# Patient Record
Sex: Female | Born: 1985 | Race: White | Hispanic: No | Marital: Married | Smoking: Never smoker
Health system: Southern US, Community
[De-identification: ages and names within clinical notes are randomized; demographics above are authoritative.]

## PROBLEM LIST (undated history)

## (undated) ENCOUNTER — Inpatient Hospital Stay (HOSPITAL_COMMUNITY): Payer: Self-pay

## (undated) DIAGNOSIS — R87629 Unspecified abnormal cytological findings in specimens from vagina: Secondary | ICD-10-CM

## (undated) HISTORY — PX: WISDOM TOOTH EXTRACTION: SHX21

## (undated) HISTORY — DX: Unspecified abnormal cytological findings in specimens from vagina: R87.629

---

## 2013-02-17 NOTE — L&D Delivery Note (Signed)
Delivery Note At 8:24 PM a viable female was delivered via Vaginal, Spontaneous Delivery (Presentation: Right Occiput Anterior).  APGAR: 6, 6; weight 6 lb 5.8 oz (2886 g).   Placenta status: Intact, Spontaneous.  Cord: 3 vessels with the following complications: None.  Cord pH: na  Anesthesia: None  Episiotomy: None Lacerations: None Est. Blood Loss (mL): 250  Mom to postpartum.  Baby to Couplet care / Skin to Skin.  Kelly Logan, Kelly Logan 12/20/2013, 10:00 PM

## 2013-06-03 ENCOUNTER — Encounter: Payer: Self-pay | Admitting: Advanced Practice Midwife

## 2013-06-03 ENCOUNTER — Ambulatory Visit (INDEPENDENT_AMBULATORY_CARE_PROVIDER_SITE_OTHER): Payer: BC Managed Care – PPO | Admitting: Advanced Practice Midwife

## 2013-06-03 VITALS — BP 128/83 | Ht 66.5 in | Wt 148.0 lb

## 2013-06-03 DIAGNOSIS — Z3480 Encounter for supervision of other normal pregnancy, unspecified trimester: Secondary | ICD-10-CM | POA: Insufficient documentation

## 2013-06-03 DIAGNOSIS — Z124 Encounter for screening for malignant neoplasm of cervix: Secondary | ICD-10-CM

## 2013-06-03 NOTE — Addendum Note (Signed)
Addended by: Granville LewisLARK, Maryela Tapper L on: 06/03/2013 11:30 AM   Modules accepted: Orders

## 2013-06-03 NOTE — Addendum Note (Signed)
Addended by: Granville LewisLARK, Ami Thornsberry L on: 06/03/2013 11:28 AM   Modules accepted: Orders

## 2013-06-03 NOTE — Progress Notes (Signed)
   Subjective:    Kelly Logan is a [id number] 6834w2d by LMP being seen today for her first obstetrical visit.  Her obstetrical history is significant for NSVD x2. Patient does intend to breast feed. Pregnancy history fully reviewed.  Patient reports breast tenderness.  Filed Vitals:   06/03/13 0847 06/03/13 0857  BP: 128/83   Height:  5' 6.5" (1.689 m)  Weight: 148 lb (67.132 kg)     HISTORY: OB History  Gravida Para Term Preterm AB SAB TAB Ectopic Multiple Living  5 2 1 1 2 2    2     # Outcome Date GA Lbr Len/2nd Weight Sex Delivery Anes PTL Lv  5 CUR           4 PRE 01/03/10   5 lb 15 oz (2.693 kg) M SVD None N Y  3 TRM 08/08/07   7 lb 3 oz (3.26 kg) M SVD None N Y  2 SAB           1 SAB              Past Medical History  Diagnosis Date  . Preterm labor   . Vaginal Pap smear, abnormal    Past Surgical History  Procedure Laterality Date  . Wisdom tooth extraction     Family History  Problem Relation Age of Onset  . Diabetes Paternal Grandmother   . Diabetes Paternal Grandfather   . Cancer Paternal Grandmother     lung  . Cancer Paternal Grandfather     prostate     Exam    Uterus:   ~10 week size  Pelvic Exam:    Perineum: No Hemorrhoids, Normal Perineum   Vulva: normal   Vagina:  normal mucosa, curdlike discharge   pH:    Cervix: multiparous appearance, no bleeding following Pap, no cervical motion tenderness and no lesions   Adnexa: normal adnexa and no mass, fullness, tenderness   Bony Pelvis: average and gynecoid  System: Breast:  normal appearance, no masses or tenderness   Skin: normal coloration and turgor, no rashes    Neurologic: oriented, normal, gait normal; reflexes normal and symmetric   Extremities: normal strength, tone, and muscle mass, ROM of all joints is normal   HEENT neck supple with midline trachea and thyroid without masses   Mouth/Teeth mucous membranes moist, pharynx normal without lesions and dental hygiene good   Neck  supple and no masses   Cardiovascular: regular rate and rhythm   Respiratory:  appears well, vitals normal, no respiratory distress, acyanotic, normal RR, ear and throat exam is normal, neck free of mass or lymphadenopathy, chest clear, no wheezing, crepitations, rhonchi, normal symmetric air entry   Abdomen: soft, non-tender; bowel sounds normal; no masses,  no organomegaly   Urinary: urethral meatus normal    Bedside ultrasound by Mariel AloeLora Clark, RN, with CRL measurement of 456w2d.  EDD based on ultrasound: 01/04/14.     Assessment:    Pregnancy: [id number] Patient Active Problem List   Diagnosis Date Noted  . Supervision of normal subsequent pregnancy 06/03/2013        Plan:     Initial labs drawn. Prenatal vitamins. Pap collected at today's visit. Problem list reviewed and updated. Genetic Screening discussed First Screen and Quad Screen: declined.  Ultrasound discussed; fetal survey: requested.  Follow up in 4 weeks. 50% of 30 min visit spent on counseling and coordination of care.     Wilmer FloorLisa A Leftwich-Kirby 06/03/2013

## 2013-06-03 NOTE — Progress Notes (Signed)
p-88  Bedside U/S showed IUP with CRL 24.9 and GA of 9 w 2 d

## 2013-06-03 NOTE — Progress Notes (Signed)
p-93  Poss yeast infection

## 2013-06-04 LAB — GC/CHLAMYDIA PROBE AMP
CT PROBE, AMP APTIMA: NEGATIVE
GC Probe RNA: NEGATIVE

## 2013-06-04 LAB — OBSTETRIC PANEL
Antibody Screen: NEGATIVE
BASOS ABS: 0 10*3/uL (ref 0.0–0.1)
Basophils Relative: 0 % (ref 0–1)
Eosinophils Absolute: 0.1 10*3/uL (ref 0.0–0.7)
Eosinophils Relative: 1 % (ref 0–5)
HCT: 38 % (ref 36.0–46.0)
HEP B S AG: NEGATIVE
Hemoglobin: 13.4 g/dL (ref 12.0–15.0)
LYMPHS ABS: 1.2 10*3/uL (ref 0.7–4.0)
LYMPHS PCT: 17 % (ref 12–46)
MCH: 30.8 pg (ref 26.0–34.0)
MCHC: 35.3 g/dL (ref 30.0–36.0)
MCV: 87.4 fL (ref 78.0–100.0)
Monocytes Absolute: 0.4 10*3/uL (ref 0.1–1.0)
Monocytes Relative: 6 % (ref 3–12)
NEUTROS PCT: 76 % (ref 43–77)
Neutro Abs: 5.2 10*3/uL (ref 1.7–7.7)
Platelets: 292 10*3/uL (ref 150–400)
RBC: 4.35 MIL/uL (ref 3.87–5.11)
RDW: 13.9 % (ref 11.5–15.5)
Rh Type: POSITIVE
Rubella: 1.13 Index — ABNORMAL HIGH (ref <0.90)
WBC: 6.8 10*3/uL (ref 4.0–10.5)

## 2013-06-04 LAB — HIV ANTIBODY (ROUTINE TESTING W REFLEX): HIV: NONREACTIVE

## 2013-06-04 LAB — WET PREP FOR TRICH, YEAST, CLUE
Clue Cells Wet Prep HPF POC: NONE SEEN
Trich, Wet Prep: NONE SEEN

## 2013-06-06 LAB — CULTURE, URINE COMPREHENSIVE: Colony Count: 25000

## 2013-06-08 ENCOUNTER — Telehealth: Payer: Self-pay | Admitting: *Deleted

## 2013-06-08 NOTE — Telephone Encounter (Signed)
Called pt to adv wet prep shows yeast and can call in Diflucan 150mg  to take today and then again in 3 days per midwife Misty StanleyLisa. Pt states she thinks she has it cleared and didn't want to take Diflucan since she was early in pregnancy. I adv pt that if anything changes we can call in meds. Pt expressed understanding.

## 2013-06-10 ENCOUNTER — Telehealth: Payer: Self-pay | Admitting: *Deleted

## 2013-06-10 DIAGNOSIS — B379 Candidiasis, unspecified: Secondary | ICD-10-CM

## 2013-06-10 MED ORDER — FLUCONAZOLE 150 MG PO TABS: 150.0000 mg | ORAL_TABLET | Freq: Once | ORAL | Status: DC

## 2013-06-10 NOTE — Telephone Encounter (Signed)
Called in Diflucan for yeast infection

## 2013-07-04 ENCOUNTER — Ambulatory Visit (INDEPENDENT_AMBULATORY_CARE_PROVIDER_SITE_OTHER): Payer: BC Managed Care – PPO | Admitting: Obstetrics and Gynecology

## 2013-07-04 ENCOUNTER — Encounter: Payer: Self-pay | Admitting: Obstetrics and Gynecology

## 2013-07-04 VITALS — BP 116/67 | HR 96 | Wt 153.0 lb

## 2013-07-04 DIAGNOSIS — Z3480 Encounter for supervision of other normal pregnancy, unspecified trimester: Secondary | ICD-10-CM

## 2013-07-04 DIAGNOSIS — O09899 Supervision of other high risk pregnancies, unspecified trimester: Secondary | ICD-10-CM

## 2013-07-04 DIAGNOSIS — Z3490 Encounter for supervision of normal pregnancy, unspecified, unspecified trimester: Secondary | ICD-10-CM

## 2013-07-04 DIAGNOSIS — O09219 Supervision of pregnancy with history of pre-term labor, unspecified trimester: Secondary | ICD-10-CM

## 2013-07-04 NOTE — Progress Notes (Signed)
Reviewed lab results: yeast on WP was treated with Diflucan and now asymptomatic.  25K staph aureus on urine culture> discussed and will repeat culture. Hx PTB at 36-37 wks, baby had short stay in NICU on oxyhood> discussed 17-P and she declines.

## 2013-07-04 NOTE — Patient Instructions (Addendum)
Second Trimester of Pregnancy The second trimester is from week 13 through week 28, months 4 through 6. The second trimester is often a time when you feel your best. Your body has also adjusted to being pregnant, and you begin to feel better physically. Usually, morning sickness has lessened or quit completely, you may have more energy, and you may have an increase in appetite. The second trimester is also a time when the fetus is growing rapidly. At the end of the sixth month, the fetus is about 9 inches long and weighs about 1 pounds. You will likely begin to feel the baby move (quickening) between 18 and 20 weeks of the pregnancy. BODY CHANGES Your body goes through many changes during pregnancy. The changes vary from woman to woman.   Your weight will continue to increase. You will notice your lower abdomen bulging out.  You may begin to get stretch marks on your hips, abdomen, and breasts.  You may develop headaches that can be relieved by medicines approved by your caregiver.  You may urinate more often because the fetus is pressing on your bladder.  You may develop or continue to have heartburn as a result of your pregnancy.  You may develop constipation because certain hormones are causing the muscles that push waste through your intestines to slow down.  You may develop hemorrhoids or swollen, bulging veins (varicose veins).  You may have back pain because of the weight gain and pregnancy hormones relaxing your joints between the bones in your pelvis and as a result of a shift in weight and the muscles that support your balance.  Your breasts will continue to grow and be tender.  Your gums may bleed and may be sensitive to brushing and flossing.  Dark spots or blotches (chloasma, mask of pregnancy) may develop on your face. This will likely fade after the baby is born.  A dark line from your belly button to the pubic area (linea nigra) may appear. This will likely fade after the  baby is born. WHAT TO EXPECT AT YOUR PRENATAL VISITS During a routine prenatal visit:  You will be weighed to make sure you and the fetus are growing normally.  Your blood pressure will be taken.  Your abdomen will be measured to track your baby's growth.  The fetal heartbeat will be listened to.  Any test results from the previous visit will be discussed. Your caregiver may ask you:  How you are feeling.  If you are feeling the baby move.  If you have had any abnormal symptoms, such as leaking fluid, bleeding, severe headaches, or abdominal cramping.  If you have any questions. Other tests that may be performed during your second trimester include:  Blood tests that check for:  Low iron levels (anemia).  Gestational diabetes (between 24 and 28 weeks).  Rh antibodies.  Urine tests to check for infections, diabetes, or protein in the urine.  An ultrasound to confirm the proper growth and development of the baby.  An amniocentesis to check for possible genetic problems.  Fetal screens for spina bifida and Down syndrome. HOME CARE INSTRUCTIONS   Avoid all smoking, herbs, alcohol, and unprescribed drugs. These chemicals affect the formation and growth of the baby.  Follow your caregiver's instructions regarding medicine use. There are medicines that are either safe or unsafe to take during pregnancy.  Exercise only as directed by your caregiver. Experiencing uterine cramps is a good sign to stop exercising.  Continue to eat regular,   healthy meals.  Wear a good support bra for breast tenderness.  Do not use hot tubs, steam rooms, or saunas.  Wear your seat belt at all times when driving.  Avoid raw meat, uncooked cheese, cat litter boxes, and soil used by cats. These carry germs that can cause birth defects in the baby.  Take your prenatal vitamins.  Try taking a stool softener (if your caregiver approves) if you develop constipation. Eat more high-fiber foods,  such as fresh vegetables or fruit and whole grains. Drink plenty of fluids to keep your urine clear or pale yellow.  Take warm sitz baths to soothe any pain or discomfort caused by hemorrhoids. Use hemorrhoid cream if your caregiver approves.  If you develop varicose veins, wear support hose. Elevate your feet for 15 minutes, 3 4 times a day. Limit salt in your diet.  Avoid heavy lifting, wear low heel shoes, and practice good posture.  Rest with your legs elevated if you have leg cramps or low back pain.  Visit your dentist if you have not gone yet during your pregnancy. Use a soft toothbrush to brush your teeth and be gentle when you floss.  A sexual relationship may be continued unless your caregiver directs you otherwise.  Continue to go to all your prenatal visits as directed by your caregiver. SEEK MEDICAL CARE IF:   You have dizziness.  You have mild pelvic cramps, pelvic pressure, or nagging pain in the abdominal area.  You have persistent nausea, vomiting, or diarrhea.  You have a bad smelling vaginal discharge.  You have pain with urination. SEEK IMMEDIATE MEDICAL CARE IF:   You have a fever.  You are leaking fluid from your vagina.  You have spotting or bleeding from your vagina.  You have severe abdominal cramping or pain.  You have rapid weight gain or loss.  You have shortness of breath with chest pain.  You notice sudden or extreme swelling of your face, hands, ankles, feet, or legs.  You have not felt your baby move in over an hour.  You have severe headaches that do not go away with medicine.  You have vision changes. Document Released: 01/28/2001 Document Revised: 10/06/2012 Document Reviewed: 04/06/2012 Exit[employer] Patient Information 2014 [employer].  

## 2013-07-05 LAB — URINE CULTURE

## 2013-08-01 ENCOUNTER — Ambulatory Visit (INDEPENDENT_AMBULATORY_CARE_PROVIDER_SITE_OTHER): Payer: BC Managed Care – PPO | Admitting: Advanced Practice Midwife

## 2013-08-01 VITALS — BP 106/66 | HR 92 | Wt 159.0 lb

## 2013-08-01 DIAGNOSIS — Z3480 Encounter for supervision of other normal pregnancy, unspecified trimester: Secondary | ICD-10-CM

## 2013-08-01 NOTE — Patient Instructions (Signed)
Second Trimester of Pregnancy The second trimester is from week 13 through week 28, months 4 through 6. The second trimester is often a time when you feel your best. Your body has also adjusted to being pregnant, and you begin to feel better physically. Usually, morning sickness has lessened or quit completely, you may have more energy, and you may have an increase in appetite. The second trimester is also a time when the fetus is growing rapidly. At the end of the sixth month, the fetus is about 9 inches long and weighs about 1 pounds. You will likely begin to feel the baby move (quickening) between 18 and 20 weeks of the pregnancy. BODY CHANGES Your body goes through many changes during pregnancy. The changes vary from woman to woman.   Your weight will continue to increase. You will notice your lower abdomen bulging out.  You may begin to get stretch marks on your hips, abdomen, and breasts.  You may develop headaches that can be relieved by medicines approved by your caregiver.  You may urinate more often because the fetus is pressing on your bladder.  You may develop or continue to have heartburn as a result of your pregnancy.  You may develop constipation because certain hormones are causing the muscles that push waste through your intestines to slow down.  You may develop hemorrhoids or swollen, bulging veins (varicose veins).  You may have back pain because of the weight gain and pregnancy hormones relaxing your joints between the bones in your pelvis and as a result of a shift in weight and the muscles that support your balance.  Your breasts will continue to grow and be tender.  Your gums may bleed and may be sensitive to brushing and flossing.  Dark spots or blotches (chloasma, mask of pregnancy) may develop on your face. This will likely fade after the baby is born.  A dark line from your belly button to the pubic area (linea nigra) may appear. This will likely fade after the  baby is born. WHAT TO EXPECT AT YOUR PRENATAL VISITS During a routine prenatal visit:  You will be weighed to make sure you and the fetus are growing normally.  Your blood pressure will be taken.  Your abdomen will be measured to track your baby's growth.  The fetal heartbeat will be listened to.  Any test results from the previous visit will be discussed. Your caregiver may ask you:  How you are feeling.  If you are feeling the baby move.  If you have had any abnormal symptoms, such as leaking fluid, bleeding, severe headaches, or abdominal cramping.  If you have any questions. Other tests that may be performed during your second trimester include:  Blood tests that check for:  Low iron levels (anemia).  Gestational diabetes (between 24 and 28 weeks).  Rh antibodies.  Urine tests to check for infections, diabetes, or protein in the urine.  An ultrasound to confirm the proper growth and development of the baby.  An amniocentesis to check for possible genetic problems.  Fetal screens for spina bifida and Down syndrome. HOME CARE INSTRUCTIONS   Avoid all smoking, herbs, alcohol, and unprescribed drugs. These chemicals affect the formation and growth of the baby.  Follow your caregiver's instructions regarding medicine use. There are medicines that are either safe or unsafe to take during pregnancy.  Exercise only as directed by your caregiver. Experiencing uterine cramps is a good sign to stop exercising.  Continue to eat regular,   healthy meals.  Wear a good support bra for breast tenderness.  Do not use hot tubs, steam rooms, or saunas.  Wear your seat belt at all times when driving.  Avoid raw meat, uncooked cheese, cat litter boxes, and soil used by cats. These carry germs that can cause birth defects in the baby.  Take your prenatal vitamins.  Try taking a stool softener (if your caregiver approves) if you develop constipation. Eat more high-fiber foods,  such as fresh vegetables or fruit and whole grains. Drink plenty of fluids to keep your urine clear or pale yellow.  Take warm sitz baths to soothe any pain or discomfort caused by hemorrhoids. Use hemorrhoid cream if your caregiver approves.  If you develop varicose veins, wear support hose. Elevate your feet for 15 minutes, 3 4 times a day. Limit salt in your diet.  Avoid heavy lifting, wear low heel shoes, and practice good posture.  Rest with your legs elevated if you have leg cramps or low back pain.  Visit your dentist if you have not gone yet during your pregnancy. Use a soft toothbrush to brush your teeth and be gentle when you floss.  A sexual relationship may be continued unless your caregiver directs you otherwise.  Continue to go to all your prenatal visits as directed by your caregiver. SEEK MEDICAL CARE IF:   You have dizziness.  You have mild pelvic cramps, pelvic pressure, or nagging pain in the abdominal area.  You have persistent nausea, vomiting, or diarrhea.  You have a bad smelling vaginal discharge.  You have pain with urination. SEEK IMMEDIATE MEDICAL CARE IF:   You have a fever.  You are leaking fluid from your vagina.  You have spotting or bleeding from your vagina.  You have severe abdominal cramping or pain.  You have rapid weight gain or loss.  You have shortness of breath with chest pain.  You notice sudden or extreme swelling of your face, hands, ankles, feet, or legs.  You have not felt your baby move in over an hour.  You have severe headaches that do not go away with medicine.  You have vision changes. Document Released: 01/28/2001 Document Revised: 10/06/2012 Document Reviewed: 04/06/2012 [employer] Patient Information 2014 [employer].  

## 2013-08-10 ENCOUNTER — Ambulatory Visit (HOSPITAL_COMMUNITY)
Admission: RE | Admit: 2013-08-10 | Discharge: 2013-08-10 | Disposition: A | Discharge status: Home / Self Care | Payer: BC Managed Care – PPO | Source: Ambulatory Visit | Attending: Advanced Practice Midwife | Admitting: Advanced Practice Midwife

## 2013-08-10 ENCOUNTER — Encounter: Payer: Self-pay | Admitting: Advanced Practice Midwife

## 2013-08-10 DIAGNOSIS — Z3480 Encounter for supervision of other normal pregnancy, unspecified trimester: Secondary | ICD-10-CM

## 2013-08-10 DIAGNOSIS — Z3689 Encounter for other specified antenatal screening: Secondary | ICD-10-CM | POA: Insufficient documentation

## 2013-08-16 ENCOUNTER — Ambulatory Visit (INDEPENDENT_AMBULATORY_CARE_PROVIDER_SITE_OTHER): Payer: BC Managed Care – PPO | Admitting: Obstetrics & Gynecology

## 2013-08-16 VITALS — BP 106/65 | HR 87 | Wt 162.0 lb

## 2013-08-16 DIAGNOSIS — Z3480 Encounter for supervision of other normal pregnancy, unspecified trimester: Secondary | ICD-10-CM

## 2013-08-16 DIAGNOSIS — R319 Hematuria, unspecified: Secondary | ICD-10-CM

## 2013-08-16 DIAGNOSIS — Z3482 Encounter for supervision of other normal pregnancy, second trimester: Secondary | ICD-10-CM

## 2013-08-16 DIAGNOSIS — N898 Other specified noninflammatory disorders of vagina: Secondary | ICD-10-CM

## 2013-08-16 NOTE — Progress Notes (Signed)
Pt had some leaking of fluid / vaginal discharge afer sex.  Spec exam negative for pooling.  BD affirm sent.  Bedside US shows good fluid.  Pt to report more symptoms if they occur not around sexual intercourse.   Pt declines f/u US for anatomy at this time. U cx sent. Anatomy does not show Rt outflow tract and there are no images to capture the fetal cavum.  Both results were reviewed with the patient and she declines further anatomy scans.

## 2013-08-16 NOTE — Progress Notes (Signed)
Had a small amount of spotting yesterday Mod Blood, large leuks, +1 protein on urine dip

## 2013-08-17 ENCOUNTER — Telehealth: Payer: Self-pay | Admitting: *Deleted

## 2013-08-17 LAB — WET PREP BY MOLECULAR PROBE
CANDIDA SPECIES: NEGATIVE
GARDNERELLA VAGINALIS: NEGATIVE
Trichomonas vaginosis: NEGATIVE

## 2013-08-17 NOTE — Telephone Encounter (Signed)
Message copied by Granville LewisLARK, Gordie Belvin L on Wed Aug 17, 2013 11:51 AM ------      Message from: Lesly DukesLEGGETT, KELLY H      Created: Wed Aug 17, 2013 11:42 AM       Negative--      RN to call ------

## 2013-08-17 NOTE — Telephone Encounter (Signed)
Pt notified if neg Affirm

## 2013-08-18 LAB — CULTURE, URINE COMPREHENSIVE
Colony Count: NO GROWTH
ORGANISM ID, BACTERIA: NO GROWTH

## 2013-08-29 ENCOUNTER — Ambulatory Visit (INDEPENDENT_AMBULATORY_CARE_PROVIDER_SITE_OTHER): Payer: BC Managed Care – PPO | Admitting: Advanced Practice Midwife

## 2013-08-29 VITALS — BP 116/71 | HR 97 | Wt 164.0 lb

## 2013-08-29 DIAGNOSIS — Z3482 Encounter for supervision of other normal pregnancy, second trimester: Secondary | ICD-10-CM

## 2013-08-29 DIAGNOSIS — Z3480 Encounter for supervision of other normal pregnancy, unspecified trimester: Secondary | ICD-10-CM

## 2013-09-01 NOTE — Progress Notes (Signed)
No further vaginal discharge since last visit.

## 2013-09-07 ENCOUNTER — Ambulatory Visit (INDEPENDENT_AMBULATORY_CARE_PROVIDER_SITE_OTHER): Payer: BC Managed Care – PPO | Admitting: Obstetrics & Gynecology

## 2013-09-07 ENCOUNTER — Encounter: Payer: Self-pay | Admitting: Obstetrics & Gynecology

## 2013-09-07 VITALS — BP 127/79 | HR 109

## 2013-09-07 DIAGNOSIS — N898 Other specified noninflammatory disorders of vagina: Secondary | ICD-10-CM

## 2013-09-07 DIAGNOSIS — O4700 False labor before 37 completed weeks of gestation, unspecified trimester: Secondary | ICD-10-CM | POA: Insufficient documentation

## 2013-09-07 DIAGNOSIS — O09219 Supervision of pregnancy with history of pre-term labor, unspecified trimester: Secondary | ICD-10-CM

## 2013-09-07 DIAGNOSIS — Z3480 Encounter for supervision of other normal pregnancy, unspecified trimester: Secondary | ICD-10-CM

## 2013-09-07 DIAGNOSIS — O4702 False labor before 37 completed weeks of gestation, second trimester: Secondary | ICD-10-CM

## 2013-09-07 LAB — FETAL FIBRONECTIN: FETAL FIBRONECTIN: NEGATIVE

## 2013-09-07 MED ORDER — HYDROXYPROGESTERONE CAPROATE 250 MG/ML IM OIL
250.0000 mg | TOPICAL_OIL | INTRAMUSCULAR | Status: AC
  Administered 2013-09-07: 250 mg via INTRAMUSCULAR

## 2013-09-07 NOTE — Progress Notes (Signed)
Pt presents with continued abdominal pain on left side that radiates to sacrum and down to leg.  Pt has been on bedrest for 1 week.  Pain better in bed but returns with standing.  Pt also c/o vaginal pressure and baby low in pelvis.  Pt has not had bleeding (has large ectropion) and has been having vaginal discharge.  Mild scaroilliac tenderness.  No CVA tenderness.  Cervix is FT and long with tone.  Bleeding from cervix after exam.  Discharge in vault noted and sent bd affirm.  FFN sent. Pt offered admission for obs.  She declined but will RTC tomorrow for cervical exam.  Pt now agrees to 17P (had refused earlier in pregnancy with Poe).  Pt understands starting at 23 weeks is not optimal but may still provide some benefit.    Pt given preterm labor and bleeding precautions to come to MAU for any issues overnight.

## 2013-09-08 ENCOUNTER — Ambulatory Visit (INDEPENDENT_AMBULATORY_CARE_PROVIDER_SITE_OTHER): Payer: BC Managed Care – PPO | Admitting: Obstetrics & Gynecology

## 2013-09-08 ENCOUNTER — Encounter: Payer: Self-pay | Admitting: Obstetrics & Gynecology

## 2013-09-08 VITALS — BP 129/70 | HR 91 | Wt 164.0 lb

## 2013-09-08 DIAGNOSIS — O09899 Supervision of other high risk pregnancies, unspecified trimester: Secondary | ICD-10-CM

## 2013-09-08 DIAGNOSIS — Z3480 Encounter for supervision of other normal pregnancy, unspecified trimester: Secondary | ICD-10-CM

## 2013-09-08 DIAGNOSIS — Z3483 Encounter for supervision of other normal pregnancy, third trimester: Secondary | ICD-10-CM

## 2013-09-08 DIAGNOSIS — O09219 Supervision of pregnancy with history of pre-term labor, unspecified trimester: Secondary | ICD-10-CM

## 2013-09-08 LAB — WET PREP BY MOLECULAR PROBE
Candida species: NEGATIVE
GARDNERELLA VAGINALIS: NEGATIVE
Trichomonas vaginosis: NEGATIVE

## 2013-09-08 NOTE — Progress Notes (Signed)
Neg FFN        Per Dr Penne LashLeggett needs cervical today.  17-P started yesterday         Blair HaileyKetones-Mod

## 2013-09-08 NOTE — Progress Notes (Signed)
She is here for a follow up visit for a cervical check. She denies frank contractions or ROM. She is having spotting since her exam yesterday. She reports good FM. Her external cervix is 1 cm but the internal os is closed. I have recommended continued pelvic rest and as much bed rest as is feasible with 2 toddlers at home. PTL precautions. RTC 1 week for 17 P and recheck/prn sooner

## 2013-09-10 ENCOUNTER — Encounter (HOSPITAL_COMMUNITY): Payer: Self-pay | Admitting: *Deleted

## 2013-09-10 ENCOUNTER — Inpatient Hospital Stay (HOSPITAL_COMMUNITY): Payer: BC Managed Care – PPO

## 2013-09-10 ENCOUNTER — Inpatient Hospital Stay (HOSPITAL_COMMUNITY)
Admission: AD | Admit: 2013-09-10 | Discharge: 2013-09-10 | Disposition: A | Discharge status: Home / Self Care | Payer: BC Managed Care – PPO | Source: Ambulatory Visit | Attending: Obstetrics & Gynecology | Admitting: Obstetrics & Gynecology

## 2013-09-10 DIAGNOSIS — O4700 False labor before 37 completed weeks of gestation, unspecified trimester: Secondary | ICD-10-CM | POA: Insufficient documentation

## 2013-09-10 DIAGNOSIS — O479 False labor, unspecified: Secondary | ICD-10-CM | POA: Diagnosis not present

## 2013-09-10 LAB — URINALYSIS, ROUTINE W REFLEX MICROSCOPIC
Bilirubin Urine: NEGATIVE
GLUCOSE, UA: NEGATIVE mg/dL
Hgb urine dipstick: NEGATIVE
Ketones, ur: NEGATIVE mg/dL
Nitrite: NEGATIVE
PH: 7.5 (ref 5.0–8.0)
Protein, ur: NEGATIVE mg/dL
SPECIFIC GRAVITY, URINE: 1.01 (ref 1.005–1.030)
Urobilinogen, UA: 0.2 mg/dL (ref 0.0–1.0)

## 2013-09-10 LAB — URINE MICROSCOPIC-ADD ON

## 2013-09-10 MED ORDER — BETAMETHASONE SOD PHOS & ACET 6 (3-3) MG/ML IJ SUSP
12.0000 mg | Freq: Once | INTRAMUSCULAR | Status: AC
  Administered 2013-09-10: 12 mg via INTRAMUSCULAR
  Filled 2013-09-10: qty 2

## 2013-09-10 NOTE — Discharge Instructions (Signed)

## 2013-09-10 NOTE — MAU Provider Note (Signed)
Attestation of Attending Supervision of Obstetric Fellow: Evaluation and management procedures were performed by the Obstetric Fellow under my supervision and collaboration.  I have reviewed the Obstetric Fellow's note and chart, and I agree with the management and plan.  Rovena Hearld, MD, FACOG Attending Obstetrician & Gynecologist Faculty Practice, [facility]   

## 2013-09-10 NOTE — MAU Provider Note (Signed)
  History     CSN: [id number]  Arrival date and time: 09/10/13 1032   First Provider Initiated Contact with Patient 09/10/13 1116      Chief Complaint  Patient presents with  . Passed mucus plug    HPI  Mucous plug: 2130 last night, was not active yesterday, no recent intercourse, reports being on bedrest x past week 2/2 left sided long side pain and had noticed some contractions(rare, lasting up to 90sec), some spotting, pain with any activity (side/lower back).  +FM, denies LOF, vaginal discharge.    Seen in clinic with similar symptoms   7/22 ==> 17-p started (previously declined), internal os closed  7/23==> internal os closed, external 1  Past Medical History  Diagnosis Date  . Preterm labor   . Vaginal Pap smear, abnormal     Past Surgical History  Procedure Laterality Date  . Wisdom tooth extraction      Family History  Problem Relation Age of Onset  . Diabetes Paternal Grandmother   . Diabetes Paternal Grandfather   . Cancer Paternal Grandmother     lung  . Cancer Paternal Grandfather     prostate    History  Substance Use Topics  . Smoking status: Never Smoker   . Smokeless tobacco: Never Used  . Alcohol Use: Yes     Comment: occassional    Allergies: No Known Allergies  Facility-administered medications prior to admission  Medication Dose Route Frequency Provider Last Rate Last Dose  . hydroxyprogesterone caproate (DELALUTIN) 250 mg/mL injection 250 mg  250 mg Intramuscular Weekly Lesly DukesKelly H Leggett, MD   250 mg at 09/07/13 1155   Prescriptions prior to admission  Medication Sig Dispense Refill  . Prenatal Vit-Fe Fumarate-FA (PRENATAL MULTIVITAMIN) TABS tablet Take 1 tablet by mouth daily at 12 noon.        Review of Systems  Constitutional: Negative for chills, weight loss and malaise/fatigue.  Respiratory: Negative for cough and shortness of breath.   Cardiovascular: Negative for chest pain and leg swelling.  Gastrointestinal: Positive for  abdominal pain. Negative for nausea, vomiting and diarrhea.  Genitourinary: Negative for dysuria, urgency and frequency (baseline).  Neurological: Positive for headaches. Negative for dizziness and tingling.   Physical Exam   Blood pressure 112/72, pulse 90, temperature 98 F (36.7 C), resp. rate 16, height 5' 6.5" (1.689 m), weight 73.653 kg (162 lb 6 oz), last menstrual period 03/23/2013.   Physical Exam  Constitutional: She is oriented to person, place, and time. She appears well-developed and well-nourished. No distress.  HENT:  Head: Normocephalic and atraumatic.  Neck: Normal range of motion.  Cardiovascular: Normal rate, regular rhythm and normal heart sounds.   Respiratory: Effort normal and breath sounds normal. No respiratory distress.  GI: Bowel sounds are normal. She exhibits no distension.  Genitourinary: Vagina normal. No vaginal discharge found.  Internal os dilated to 1, external 2  Musculoskeletal: She exhibits no edema.  Neurological: She is alert and oriented to person, place, and time.  Skin: Skin is warm and dry. No erythema.    MAU Course  Procedures  MDM NST ==> no contractions sono cervical length ==> normal betamethasone Assessment and Plan  Discussed risks and benefits of admission at this time patient would prefer to be discharged with follow up tomorrow for 2nd dose of betamethasone and possible recheck.  I agree with plan given normal cervical length and negative FFN.  Kelly Logan 09/10/2013, 11:17 AM

## 2013-09-11 ENCOUNTER — Inpatient Hospital Stay (HOSPITAL_COMMUNITY)
Admission: AD | Admit: 2013-09-11 | Discharge: 2013-09-11 | Disposition: A | Discharge status: Home / Self Care | Payer: BC Managed Care – PPO | Source: Ambulatory Visit | Attending: Obstetrics & Gynecology | Admitting: Obstetrics & Gynecology

## 2013-09-11 DIAGNOSIS — O4700 False labor before 37 completed weeks of gestation, unspecified trimester: Secondary | ICD-10-CM | POA: Insufficient documentation

## 2013-09-11 MED ORDER — BETAMETHASONE SOD PHOS & ACET 6 (3-3) MG/ML IJ SUSP
12.0000 mg | Freq: Once | INTRAMUSCULAR | Status: AC
  Administered 2013-09-11: 12 mg via INTRAMUSCULAR
  Filled 2013-09-11: qty 2

## 2013-09-11 NOTE — MAU Note (Signed)
Pt presents to MAU for repeat betamethasone injection. Requested copy of prenatal records for travel out of state to stay with her mother.

## 2013-09-14 ENCOUNTER — Encounter: Payer: BC Managed Care – PPO | Admitting: Obstetrics & Gynecology

## 2013-10-19 ENCOUNTER — Other Ambulatory Visit: Payer: BC Managed Care – PPO

## 2013-10-19 DIAGNOSIS — O99810 Abnormal glucose complicating pregnancy: Secondary | ICD-10-CM

## 2013-10-19 NOTE — Addendum Note (Signed)
Addended by: Granville Lewis on: 10/19/2013 08:07 AM   Modules accepted: Orders

## 2013-10-20 ENCOUNTER — Telehealth: Payer: Self-pay | Admitting: *Deleted

## 2013-10-20 LAB — GLUCOSE TOLERANCE, 3 HOURS
GLUCOSE, FASTING-GESTATIONAL: 73 mg/dL (ref 70–104)
Glucose Tolerance, 1 hour: 130 mg/dL (ref 70–189)
Glucose Tolerance, 2 hour: 107 mg/dL (ref 70–164)
Glucose, GTT - 3 Hour: 73 mg/dL (ref 70–144)

## 2013-10-20 NOTE — Telephone Encounter (Signed)
Pt notified of normal 3 hr GTT. 

## 2013-10-31 ENCOUNTER — Encounter: Payer: BC Managed Care – PPO | Admitting: Obstetrics & Gynecology

## 2013-11-07 ENCOUNTER — Ambulatory Visit (INDEPENDENT_AMBULATORY_CARE_PROVIDER_SITE_OTHER): Payer: BC Managed Care – PPO | Admitting: Advanced Practice Midwife

## 2013-11-07 VITALS — BP 115/72 | HR 90 | Wt 180.0 lb

## 2013-11-07 DIAGNOSIS — O09899 Supervision of other high risk pregnancies, unspecified trimester: Secondary | ICD-10-CM

## 2013-11-07 DIAGNOSIS — O09219 Supervision of pregnancy with history of pre-term labor, unspecified trimester: Principal | ICD-10-CM

## 2013-11-07 DIAGNOSIS — Z3480 Encounter for supervision of other normal pregnancy, unspecified trimester: Secondary | ICD-10-CM

## 2013-11-07 NOTE — Progress Notes (Signed)
Doing well.  Good fetal movement, denies vaginal bleeding, LOF, regular contractions.  Desires waterbirth, discussed class, consents required.  Pt to enroll in class, sign consents at next visit. On weekly 17-P, last delivery at 36 weeks.  Discussed requirements for waterbirth including at least [redacted] weeks gestation.  Pt states understanding.

## 2013-11-21 ENCOUNTER — Ambulatory Visit (INDEPENDENT_AMBULATORY_CARE_PROVIDER_SITE_OTHER): Payer: BC Managed Care – PPO | Admitting: Advanced Practice Midwife

## 2013-11-21 ENCOUNTER — Encounter: Payer: Self-pay | Admitting: *Deleted

## 2013-11-21 VITALS — BP 118/81 | HR 83 | Wt 178.0 lb

## 2013-11-21 DIAGNOSIS — Z23 Encounter for immunization: Secondary | ICD-10-CM | POA: Diagnosis not present

## 2013-11-21 DIAGNOSIS — N760 Acute vaginitis: Secondary | ICD-10-CM | POA: Diagnosis not present

## 2013-11-21 DIAGNOSIS — O09893 Supervision of other high risk pregnancies, third trimester: Secondary | ICD-10-CM

## 2013-11-21 DIAGNOSIS — O09213 Supervision of pregnancy with history of pre-term labor, third trimester: Secondary | ICD-10-CM

## 2013-11-21 DIAGNOSIS — Z3483 Encounter for supervision of other normal pregnancy, third trimester: Secondary | ICD-10-CM

## 2013-11-21 MED ORDER — TETANUS-DIPHTH-ACELL PERTUSSIS 5-2.5-18.5 LF-MCG/0.5 IM SUSP
0.5000 mL | Freq: Once | INTRAMUSCULAR | Status: AC
  Administered 2013-11-21: 0.5 mL via INTRAMUSCULAR

## 2013-11-21 NOTE — Patient Instructions (Signed)
Braxton Hicks Contractions °Contractions of the uterus can occur throughout pregnancy. Contractions are not always a sign that you are in labor.  °WHAT ARE BRAXTON HICKS CONTRACTIONS?  °Contractions that occur before labor are called Braxton Hicks contractions, or false labor. Toward the end of pregnancy (32-34 weeks), these contractions can develop more often and may become more forceful. This is not true labor because these contractions do not result in opening (dilatation) and thinning of the cervix. They are sometimes difficult to tell apart from true labor because these contractions can be forceful and people have different pain tolerances. You should not feel embarrassed if you go to the hospital with false labor. Sometimes, the only way to tell if you are in true labor is for your health care provider to look for changes in the cervix. °If there are no prenatal problems or other health problems associated with the pregnancy, it is completely safe to be sent home with false labor and await the onset of true labor. °HOW CAN YOU TELL THE DIFFERENCE BETWEEN TRUE AND FALSE LABOR? °False Labor °· The contractions of false labor are usually shorter and not as hard as those of true labor.   °· The contractions are usually irregular.   °· The contractions are often felt in the front of the lower abdomen and in the groin.   °· The contractions may go away when you walk around or change positions while lying down.   °· The contractions get weaker and are shorter lasting as time goes on.   °· The contractions do not usually become progressively stronger, regular, and closer together as with true labor.   °True Labor °· Contractions in true labor last 30-70 seconds, become very regular, usually become more intense, and increase in frequency.   °· The contractions do not go away with walking.   °· The discomfort is usually felt in the top of the uterus and spreads to the lower abdomen and low back.   °· True labor can be  determined by your health care provider with an exam. This will show that the cervix is dilating and getting thinner.   °WHAT TO REMEMBER °· Keep up with your usual exercises and follow other instructions given by your health care provider.   °· Take medicines as directed by your health care provider.   °· Keep your regular prenatal appointments.   °· Eat and drink lightly if you think you are going into labor.   °· If Braxton Hicks contractions are making you uncomfortable:   °¨ Change your position from lying down or resting to walking, or from walking to resting.   °¨ Sit and rest in a tub of warm water.   °¨ Drink 2-3 glasses of water. Dehydration may cause these contractions.   °¨ Do slow and deep breathing several times an hour.   °WHEN SHOULD I SEEK IMMEDIATE MEDICAL CARE? °Seek immediate medical care if: °· Your contractions become stronger, more regular, and closer together.   °· You have fluid leaking or gushing from your vagina.   °· You have a fever.   °· You pass blood-tinged mucus.   °· You have vaginal bleeding.   °· You have continuous abdominal pain.   °· You have low back pain that you never had before.   °· You feel your baby's head pushing down and causing pelvic pressure.   °· Your baby is not moving as much as it used to.   °Document Released: 02/03/2005 Document Revised: 02/08/2013 Document Reviewed: 11/15/2012 °ExitCare® Patient Information ©2015 [employer]. This information is not intended to replace advice given to you by your health care   provider. Make sure you discuss any questions you have with your health care provider. ° °

## 2013-11-21 NOTE — Progress Notes (Signed)
Concerned about increased UC's, (few per day) and increased white/mucoid discharge. SSE neg pool, mucus present. Wet prep. No urinary complaints. PTL precautions. Waterbirth class 10/21.

## 2013-11-21 NOTE — Progress Notes (Signed)
Pt is still taking her 17-P.  Husband is giving her the shots weekly

## 2013-11-22 LAB — WET PREP, GENITAL
CLUE CELLS WET PREP: NONE SEEN
Trich, Wet Prep: NONE SEEN
Yeast Wet Prep HPF POC: NONE SEEN

## 2013-12-01 ENCOUNTER — Ambulatory Visit (INDEPENDENT_AMBULATORY_CARE_PROVIDER_SITE_OTHER): Payer: BC Managed Care – PPO | Admitting: Advanced Practice Midwife

## 2013-12-01 VITALS — BP 137/84 | HR 117 | Wt 178.0 lb

## 2013-12-01 DIAGNOSIS — O09893 Supervision of other high risk pregnancies, third trimester: Secondary | ICD-10-CM

## 2013-12-01 DIAGNOSIS — O09213 Supervision of pregnancy with history of pre-term labor, third trimester: Secondary | ICD-10-CM

## 2013-12-01 NOTE — Progress Notes (Signed)
Doing well.  Good fetal movement, denies vaginal bleeding, LOF, regular contractions. Reports baby punching/kicking her bladder and cervix last night, then gush of fluid enough to wet her underwear. Today, small gushes of clear fluid whenever she walks, bends over, etc.  Negative pooling, negative nitrizine, negative fern today.  Reviewed signs of labor/reasons to come to hospital.  Plans waterbirth class Wednesday next week.

## 2013-12-01 NOTE — Progress Notes (Signed)
Feels gushes of fluid whenever she moves.

## 2013-12-05 ENCOUNTER — Ambulatory Visit (INDEPENDENT_AMBULATORY_CARE_PROVIDER_SITE_OTHER): Payer: BC Managed Care – PPO | Admitting: Advanced Practice Midwife

## 2013-12-05 ENCOUNTER — Inpatient Hospital Stay (HOSPITAL_COMMUNITY): Payer: BC Managed Care – PPO

## 2013-12-05 ENCOUNTER — Encounter (HOSPITAL_COMMUNITY): Payer: Self-pay | Admitting: *Deleted

## 2013-12-05 ENCOUNTER — Inpatient Hospital Stay (HOSPITAL_COMMUNITY)
Admission: AD | Admit: 2013-12-05 | Discharge: 2013-12-05 | Disposition: A | Discharge status: Home / Self Care | Payer: BC Managed Care – PPO | Source: Ambulatory Visit | Attending: Family Medicine | Admitting: Family Medicine

## 2013-12-05 VITALS — BP 128/91 | HR 117 | Wt 181.0 lb

## 2013-12-05 DIAGNOSIS — O368131 Decreased fetal movements, third trimester, fetus 1: Secondary | ICD-10-CM

## 2013-12-05 DIAGNOSIS — Z3A35 35 weeks gestation of pregnancy: Secondary | ICD-10-CM | POA: Diagnosis not present

## 2013-12-05 DIAGNOSIS — O368130 Decreased fetal movements, third trimester, not applicable or unspecified: Secondary | ICD-10-CM | POA: Insufficient documentation

## 2013-12-05 DIAGNOSIS — N898 Other specified noninflammatory disorders of vagina: Secondary | ICD-10-CM

## 2013-12-05 DIAGNOSIS — Z8751 Personal history of pre-term labor: Secondary | ICD-10-CM

## 2013-12-05 DIAGNOSIS — O368390 Maternal care for abnormalities of the fetal heart rate or rhythm, unspecified trimester, not applicable or unspecified: Secondary | ICD-10-CM

## 2013-12-05 DIAGNOSIS — O26893 Other specified pregnancy related conditions, third trimester: Secondary | ICD-10-CM

## 2013-12-05 DIAGNOSIS — O403XX1 Polyhydramnios, third trimester, fetus 1: Secondary | ICD-10-CM

## 2013-12-05 LAB — OB RESULTS CONSOLE GBS: GBS: NEGATIVE

## 2013-12-05 LAB — OB RESULTS CONSOLE GC/CHLAMYDIA
Chlamydia: NEGATIVE
Gonorrhea: NEGATIVE

## 2013-12-05 LAB — AMNISURE RUPTURE OF MEMBRANE (ROM) NOT AT ARMC: AMNISURE: NEGATIVE

## 2013-12-05 NOTE — Progress Notes (Signed)
-  Gush of fluid 2 days ago. Needing a panty liner since then, but no further gushes. SSE: Neg pol, equivocal fern. To MAU for amnisure -Decreased FM. NST reactive.  GBS collected

## 2013-12-05 NOTE — MAU Note (Signed)
Pt presents to MAU stating that Ivonne AndrewV Smith CNM sent her over from clinic for an amnisure.

## 2013-12-05 NOTE — MAU Provider Note (Cosign Needed)
History     CSN: [id number]  Arrival date and time: 12/05/13 1227   None     Chief Complaint  Patient presents with  . amnisure    HPI 28 year old [id number] at 5471w5d who presents to MAU for evaluation of decreased fetal movement and possible rupture of membranes. Usual state of health until earlier today when she noticed a small amount of clear leakage on her underwear. Has not continued to leak fluid throughout the day. Throughout the day has not felt her baby move as much as usual although still feeling some movement. Denies contractions, vaginal bleeding or other concerning symptoms. Slow leak with prior baby so worried that it may be happening again.   OB History   Grav Para Term Preterm Abortions TAB SAB Ect Mult Living   5 2 1 1 2  2   2       Past Medical History  Diagnosis Date  . Preterm labor   . Vaginal Pap smear, abnormal     Past Surgical History  Procedure Laterality Date  . Wisdom tooth extraction      Family History  Problem Relation Age of Onset  . Diabetes Paternal Grandmother   . Diabetes Paternal Grandfather   . Cancer Paternal Grandmother     lung  . Cancer Paternal Grandfather     prostate    History  Substance Use Topics  . Smoking status: Never Smoker   . Smokeless tobacco: Never Used  . Alcohol Use: Yes     Comment: occassional    Allergies: No Known Allergies  Facility-administered medications prior to admission  Medication Dose Route Frequency Provider Last Rate Last Dose  . hydroxyprogesterone caproate (DELALUTIN) 250 mg/mL injection 250 mg  250 mg Intramuscular Weekly Lesly DukesKelly H Leggett, MD   250 mg at 09/07/13 1155   Prescriptions prior to admission  Medication Sig Dispense Refill  . hydroxyprogesterone caproate (DELALUTIN) 250 mg/mL OIL injection Inject 250 mg into the muscle every 7 (seven) days. Every Thursday      . Prenatal Vit-Fe Fumarate-FA (PRENATAL MULTIVITAMIN) TABS tablet Take 1 tablet by mouth daily at 12 noon.         Review of Systems  Constitutional: Negative.   HENT: Negative.   Eyes: Negative.   Respiratory: Negative.   Cardiovascular: Negative.   Gastrointestinal: Negative.   Genitourinary: Negative.   Musculoskeletal: Negative.   Skin: Negative.   Neurological: Negative.   Endo/Heme/Allergies: Negative.   Psychiatric/Behavioral: Negative.   All other systems reviewed and are negative.  Physical Exam   Blood pressure 111/70, pulse 111, temperature 98.2 F (36.8 C), temperature source Oral, last menstrual period 03/23/2013.  Physical Exam  Nursing note and vitals reviewed. Constitutional: She is oriented to person, place, and time. She appears well-developed and well-nourished. No distress.  Cardiovascular: Normal rate.   Respiratory: Effort normal.  GI: Soft. Bowel sounds are normal. She exhibits no distension. There is no tenderness.  Neurological: She is alert and oriented to person, place, and time.  Skin: Skin is warm and dry.  Psychiatric: She has a normal mood and affect. Her behavior is normal.    MAU Course  Procedures  MDM Amniosure negative NST reactive but with one variable deceleration BPP 8/8  Assessment and Plan  28 year old [id number] at 5071w5d who presents with decreased fetal movement and concern for loss of fluid. Membranes intact base on examination and amniosure. During fetal monitoring, one variable deceleration was noted. Otherwise reassuring. Obtained BPP  which was 8/8 with normal fluid levels. Overall reassured. Patient no in active labor and safe for discharge to home. Strict FM/PTL precautions given. Will follow up in clinic later this week for recheck or return to MAU sooner if she develops any concerning symptoms.   Kelly Logan, Kelly Logan 12/05/2013, 3:23 PM

## 2013-12-05 NOTE — Discharge Instructions (Signed)
[facility]. You had an ultrasound (BPP with fluid level check) which was normal and reassuring. You also had an amnisure test which showed no evidence of your water being broken. You should follow up with your prenatal provider later this week for recheck. Please seek care sooner if you develop contractions, vaginal bleeding, gush of fluid, or persistent decreased fetal movement.   Fetal Biophysical Profile This is a test that measures five different variables of the fetus: Heart rate, breathing movement, total movement of the baby, fetal muscle tone, the amount of amniotic fluid, and the heart rate activity of the fetus. The five variables are measured individually and contribute either a 2 or a 0 to the overall scoring of the test. The measurements are as follows:  Fetal heart rate activity. This is measured and scored in the same way as a non-stress test. The fetal heart rate is considered reactive when there are movement-associated fetal heart rate increases of at least 15 beats per minute above baseline, and 15 seconds in duration over a 20-minute period. A score of 2 is given for reactivity, and a score of 0 indicates that the fetal heart rate is non-reactive.  Fetal breathing movements. This is scored based on fetal breathing movements and indicate fetal well-being. Their absence may indicate a low oxygen level for the fetus. Fetal breathing increases in frequency and uniformity after the 36th week of pregnancy. To earn a score of 2, the fetus must have at least one episode of fetal breathing lasting at least 60 seconds within a 30-minute observation. Absence of this breathing is scored a 0 on the BPP.  Fetal body movements. Fetal activity is a reflection of brain integrity and function. The presence of at least three episodes of fetal movements within a 30-minute period is given a score of 2. A score of 0 is given with two or less movements in this  time period. Fetal activity is highest 1 to 3 hours after the mother has eaten a meal.  Fetal tone. In the uterus, the fetus is normally in a position of flexion. This means the head is bent down towards the knees. The fetus also stretches, rolls, and moves in the uterus. The arms, legs, trunk, and head may be flexed and extended. A score of 2 is earned when there is at least one episode of active extension with return flexion. A score of 0 is given for slow extension with a return to only partial flexion. Fetal movement not followed by return to flexion, limbs or spine in extension, and an open fetal hand score 0.  Amniotic fluid volume. Amniotic fluid volume has been demonstrated to be a good method of predicting fetal distress. Too little amniotic fluid has been associated with fetal abnormalities, slow uterine growth, and over due pregnancy. A score of 2 is given for this when there is at least one pocket of amniotic fluid that measures 1 cm in a specific area. A score of 0 indicates either that fluid is absent in most areas of the uterine cavity or that the largest pocket of fluid measures less than 1 cm. PREPARATION FOR TEST No preparation or fasting is necessary. NORMAL FINDINGS  A score of 8-10 points (if amniotic fluid volume is adequate).  Possible critical values: Less than 4 may necessitate immediate delivery of fetus. Ranges for normal findings may vary among different laboratories and hospitals. You should always check with your doctor after having  lab work or other tests done to discuss the meaning of your test results and whether your values are considered within normal limits. MEANING OF TEST  Your caregiver will go over the test results with you and discuss the importance and meaning of your results, as well as treatment options and the need for additional tests if necessary. OBTAINING THE TEST RESULTS  It is your responsibility to obtain your test results. Ask the lab or department  performing the test when and how you will get your results. Document Released: 06/06/2004 Document Revised: 04/28/2011 Document Reviewed: 01/14/2008 [facility] Patient Information 2015 [employer] [place]. This information is not intended to replace advice given to you by your health care provider. Make sure you discuss any questions you have with your health care provider.

## 2013-12-05 NOTE — Progress Notes (Signed)
Had an episode of pants wet on Sat

## 2013-12-05 NOTE — Patient Instructions (Signed)
Braxton Hicks Contractions °Contractions of the uterus can occur throughout pregnancy. Contractions are not always a sign that you are in labor.  °WHAT ARE BRAXTON HICKS CONTRACTIONS?  °Contractions that occur before labor are called Braxton Hicks contractions, or false labor. Toward the end of pregnancy (32-34 weeks), these contractions can develop more often and may become more forceful. This is not true labor because these contractions do not result in opening (dilatation) and thinning of the cervix. They are sometimes difficult to tell apart from true labor because these contractions can be forceful and people have different pain tolerances. You should not feel embarrassed if you go to the hospital with false labor. Sometimes, the only way to tell if you are in true labor is for your health care provider to look for changes in the cervix. °If there are no prenatal problems or other health problems associated with the pregnancy, it is completely safe to be sent home with false labor and await the onset of true labor. °HOW CAN YOU TELL THE DIFFERENCE BETWEEN TRUE AND FALSE LABOR? °False Labor °· The contractions of false labor are usually shorter and not as hard as those of true labor.   °· The contractions are usually irregular.   °· The contractions are often felt in the front of the lower abdomen and in the groin.   °· The contractions may go away when you walk around or change positions while lying down.   °· The contractions get weaker and are shorter lasting as time goes on.   °· The contractions do not usually become progressively stronger, regular, and closer together as with true labor.   °True Labor °· Contractions in true labor last 30-70 seconds, become very regular, usually become more intense, and increase in frequency.   °· The contractions do not go away with walking.   °· The discomfort is usually felt in the top of the uterus and spreads to the lower abdomen and low back.   °· True labor can be  determined by your health care provider with an exam. This will show that the cervix is dilating and getting thinner.   °WHAT TO REMEMBER °· Keep up with your usual exercises and follow other instructions given by your health care provider.   °· Take medicines as directed by your health care provider.   °· Keep your regular prenatal appointments.   °· Eat and drink lightly if you think you are going into labor.   °· If Braxton Hicks contractions are making you uncomfortable:   °¨ Change your position from lying down or resting to walking, or from walking to resting.   °¨ Sit and rest in a tub of warm water.   °¨ Drink 2-3 glasses of water. Dehydration may cause these contractions.   °¨ Do slow and deep breathing several times an hour.   °WHEN SHOULD I SEEK IMMEDIATE MEDICAL CARE? °Seek immediate medical care if: °· Your contractions become stronger, more regular, and closer together.   °· You have fluid leaking or gushing from your vagina.   °· You have a fever.   °· You pass blood-tinged mucus.   °· You have vaginal bleeding.   °· You have continuous abdominal pain.   °· You have low back pain that you never had before.   °· You feel your baby's head pushing down and causing pelvic pressure.   °· Your baby is not moving as much as it used to.   °Document Released: 02/03/2005 Document Revised: 02/08/2013 Document Reviewed: 11/15/2012 °ExitCare® Patient Information ©2015 [employer]. This information is not intended to replace advice given to you by your health care   provider. Make sure you discuss any questions you have with your health care provider. ° °

## 2013-12-06 ENCOUNTER — Encounter: Payer: Self-pay | Admitting: Advanced Practice Midwife

## 2013-12-06 DIAGNOSIS — O322XX0 Maternal care for transverse and oblique lie, not applicable or unspecified: Secondary | ICD-10-CM | POA: Insufficient documentation

## 2013-12-06 LAB — GC/CHLAMYDIA PROBE AMP
CT Probe RNA: NEGATIVE
GC Probe RNA: NEGATIVE

## 2013-12-07 LAB — CULTURE, BETA STREP (GROUP B ONLY)

## 2013-12-08 ENCOUNTER — Encounter: Payer: Self-pay | Admitting: Advanced Practice Midwife

## 2013-12-12 ENCOUNTER — Ambulatory Visit (INDEPENDENT_AMBULATORY_CARE_PROVIDER_SITE_OTHER): Payer: BC Managed Care – PPO | Admitting: Advanced Practice Midwife

## 2013-12-12 ENCOUNTER — Encounter: Payer: Self-pay | Admitting: Advanced Practice Midwife

## 2013-12-12 VITALS — BP 130/69 | HR 86 | Wt 184.0 lb

## 2013-12-12 DIAGNOSIS — O09213 Supervision of pregnancy with history of pre-term labor, third trimester: Secondary | ICD-10-CM

## 2013-12-12 DIAGNOSIS — O09893 Supervision of other high risk pregnancies, third trimester: Secondary | ICD-10-CM

## 2013-12-12 NOTE — Patient Instructions (Signed)

## 2013-12-12 NOTE — Progress Notes (Signed)
Doing well. Labor precautions  

## 2013-12-19 ENCOUNTER — Ambulatory Visit (INDEPENDENT_AMBULATORY_CARE_PROVIDER_SITE_OTHER): Payer: BC Managed Care – PPO | Admitting: Advanced Practice Midwife

## 2013-12-19 ENCOUNTER — Encounter: Payer: Self-pay | Admitting: Advanced Practice Midwife

## 2013-12-19 VITALS — BP 126/77 | HR 94 | Wt 184.0 lb

## 2013-12-19 DIAGNOSIS — O09893 Supervision of other high risk pregnancies, third trimester: Secondary | ICD-10-CM

## 2013-12-19 DIAGNOSIS — O09213 Supervision of pregnancy with history of pre-term labor, third trimester: Secondary | ICD-10-CM

## 2013-12-19 NOTE — Progress Notes (Signed)
Labor precautions. Worried about fast labor.

## 2013-12-19 NOTE — Patient Instructions (Signed)

## 2013-12-20 ENCOUNTER — Encounter (HOSPITAL_COMMUNITY): Payer: Self-pay | Admitting: *Deleted

## 2013-12-20 ENCOUNTER — Inpatient Hospital Stay (HOSPITAL_COMMUNITY)
Admission: AD | Admit: 2013-12-20 | Discharge: 2013-12-22 | DRG: 775 | Disposition: A | Discharge status: Home / Self Care | Payer: BC Managed Care – PPO | Source: Ambulatory Visit | Attending: Obstetrics & Gynecology | Admitting: Obstetrics & Gynecology

## 2013-12-20 DIAGNOSIS — Z3A37 37 weeks gestation of pregnancy: Secondary | ICD-10-CM | POA: Diagnosis present

## 2013-12-20 DIAGNOSIS — O322XX0 Maternal care for transverse and oblique lie, not applicable or unspecified: Secondary | ICD-10-CM

## 2013-12-20 DIAGNOSIS — Z3483 Encounter for supervision of other normal pregnancy, third trimester: Secondary | ICD-10-CM

## 2013-12-20 DIAGNOSIS — O4292 Full-term premature rupture of membranes, unspecified as to length of time between rupture and onset of labor: Secondary | ICD-10-CM

## 2013-12-20 LAB — CBC
HCT: 34.9 % — ABNORMAL LOW (ref 36.0–46.0)
HEMOGLOBIN: 12.3 g/dL (ref 12.0–15.0)
MCH: 31.3 pg (ref 26.0–34.0)
MCHC: 35.2 g/dL (ref 30.0–36.0)
MCV: 88.8 fL (ref 78.0–100.0)
Platelets: 226 10*3/uL (ref 150–400)
RBC: 3.93 MIL/uL (ref 3.87–5.11)
RDW: 12.9 % (ref 11.5–15.5)
WBC: 16.2 10*3/uL — ABNORMAL HIGH (ref 4.0–10.5)

## 2013-12-20 MED ORDER — CITRIC ACID-SODIUM CITRATE 334-500 MG/5ML PO SOLN: 30.0000 mL | ORAL | Status: DC | PRN

## 2013-12-20 MED ORDER — ONDANSETRON HCL 4 MG/2ML IJ SOLN: 4.0000 mg | INTRAMUSCULAR | Status: DC | PRN

## 2013-12-20 MED ORDER — OXYCODONE-ACETAMINOPHEN 5-325 MG PO TABS: 1.0000 | ORAL_TABLET | ORAL | Status: DC | PRN

## 2013-12-20 MED ORDER — BENZOCAINE-MENTHOL 20-0.5 % EX AERO
1.0000 | INHALATION_SPRAY | CUTANEOUS | Status: DC | PRN
Start: 2013-12-20 — End: 2013-12-22

## 2013-12-20 MED ORDER — OXYCODONE-ACETAMINOPHEN 5-325 MG PO TABS
1.0000 | ORAL_TABLET | ORAL | Status: DC | PRN
Start: 2013-12-20 — End: 2013-12-22
  Administered 2013-12-21: 1 via ORAL
  Filled 2013-12-20: qty 1

## 2013-12-20 MED ORDER — DIBUCAINE 1 % RE OINT: 1.0000 "application " | TOPICAL_OINTMENT | RECTAL | Status: DC | PRN

## 2013-12-20 MED ORDER — LACTATED RINGERS IV SOLN: INTRAVENOUS | Status: DC

## 2013-12-20 MED ORDER — LACTATED RINGERS IV SOLN: 500.0000 mL | INTRAVENOUS | Status: DC | PRN

## 2013-12-20 MED ORDER — SIMETHICONE 80 MG PO CHEW: 80.0000 mg | CHEWABLE_TABLET | ORAL | Status: DC | PRN

## 2013-12-20 MED ORDER — DIPHENHYDRAMINE HCL 25 MG PO CAPS: 25.0000 mg | ORAL_CAPSULE | Freq: Four times a day (QID) | ORAL | Status: DC | PRN

## 2013-12-20 MED ORDER — OXYCODONE-ACETAMINOPHEN 5-325 MG PO TABS
2.0000 | ORAL_TABLET | ORAL | Status: DC | PRN
Start: 2013-12-20 — End: 2013-12-22

## 2013-12-20 MED ORDER — OXYTOCIN 40 UNITS IN LACTATED RINGERS INFUSION - SIMPLE MED: 62.5000 mL/h | INTRAVENOUS | Status: DC

## 2013-12-20 MED ORDER — SENNOSIDES-DOCUSATE SODIUM 8.6-50 MG PO TABS
2.0000 | ORAL_TABLET | ORAL | Status: DC
  Administered 2013-12-20 – 2013-12-22 (×2): 2 via ORAL
  Filled 2013-12-20 (×2): qty 2

## 2013-12-20 MED ORDER — TETANUS-DIPHTH-ACELL PERTUSSIS 5-2.5-18.5 LF-MCG/0.5 IM SUSP
0.5000 mL | Freq: Once | INTRAMUSCULAR | Status: DC
Start: 2013-12-21 — End: 2013-12-21

## 2013-12-20 MED ORDER — LIDOCAINE HCL (PF) 1 % IJ SOLN
30.0000 mL | INTRAMUSCULAR | Status: DC | PRN
  Filled 2013-12-20: qty 30

## 2013-12-20 MED ORDER — ONDANSETRON HCL 4 MG/2ML IJ SOLN: 4.0000 mg | Freq: Four times a day (QID) | INTRAMUSCULAR | Status: DC | PRN

## 2013-12-20 MED ORDER — IBUPROFEN 600 MG PO TABS
600.0000 mg | ORAL_TABLET | Freq: Four times a day (QID) | ORAL | Status: DC | PRN
  Administered 2013-12-20: 600 mg via ORAL
  Filled 2013-12-20: qty 1

## 2013-12-20 MED ORDER — ZOLPIDEM TARTRATE 5 MG PO TABS: 5.0000 mg | ORAL_TABLET | Freq: Every evening | ORAL | Status: DC | PRN

## 2013-12-20 MED ORDER — FLEET ENEMA 7-19 GM/118ML RE ENEM: 1.0000 | ENEMA | RECTAL | Status: DC | PRN

## 2013-12-20 MED ORDER — OXYCODONE-ACETAMINOPHEN 5-325 MG PO TABS: 2.0000 | ORAL_TABLET | ORAL | Status: DC | PRN

## 2013-12-20 MED ORDER — ONDANSETRON HCL 4 MG PO TABS: 4.0000 mg | ORAL_TABLET | ORAL | Status: DC | PRN

## 2013-12-20 MED ORDER — OXYTOCIN BOLUS FROM INFUSION: 500.0000 mL | INTRAVENOUS | Status: DC

## 2013-12-20 MED ORDER — PRENATAL MULTIVITAMIN CH
1.0000 | ORAL_TABLET | Freq: Every day | ORAL | Status: DC
  Administered 2013-12-21: 1 via ORAL
  Filled 2013-12-20: qty 1

## 2013-12-20 MED ORDER — WITCH HAZEL-GLYCERIN EX PADS: 1.0000 "application " | MEDICATED_PAD | CUTANEOUS | Status: DC | PRN

## 2013-12-20 MED ORDER — LANOLIN HYDROUS EX OINT: TOPICAL_OINTMENT | CUTANEOUS | Status: DC | PRN

## 2013-12-20 MED ORDER — ACETAMINOPHEN 325 MG PO TABS: 650.0000 mg | ORAL_TABLET | ORAL | Status: DC | PRN

## 2013-12-20 MED ORDER — IBUPROFEN 600 MG PO TABS
600.0000 mg | ORAL_TABLET | Freq: Four times a day (QID) | ORAL | Status: DC
  Administered 2013-12-21 – 2013-12-22 (×5): 600 mg via ORAL
  Filled 2013-12-20 (×5): qty 1

## 2013-12-20 NOTE — MAU Note (Signed)
Contractions began at 1700. Denies leaking of fluid and bleeding, baby has been active. Pt will be having doula and midwife for a water birth.

## 2013-12-20 NOTE — MAU Note (Signed)
Pt presented to MAU with contractions.

## 2013-12-20 NOTE — Progress Notes (Addendum)
Pts doula opened the pts room door and screamed for help. As the RN arrived to the room the babys head was already delivered. RN delivered the rest of the body of the newborn through one loop nuchal cord without difficulties.

## 2013-12-20 NOTE — H&P (Signed)
Kelly Logan is a 28 y.o. female presenting for regular, strong ctx and SROM since apx 3 hrs ago. Maternal Medical History:  Reason for admission: Contractions.  Nausea.  Contractions: Onset was 3-5 hours ago.   Frequency: regular.   Perceived severity is strong.    Fetal activity: Perceived fetal activity is normal.   Last perceived fetal movement was within the past hour.      OB History    Gravida Para Term Preterm AB TAB SAB Ectopic Multiple Living   5 2 1 1 2  2   2      Past Medical History  Diagnosis Date  . Preterm labor   . Vaginal Pap smear, abnormal    Past Surgical History  Procedure Laterality Date  . Wisdom tooth extraction     Family History: family history includes Cancer in her paternal grandfather and paternal grandmother; Diabetes in her paternal grandfather and paternal grandmother. Social History:  reports that she has never smoked. She has never used smokeless tobacco. She reports that she drinks alcohol. She reports that she does not use illicit drugs.   Prenatal Transfer Tool  Maternal Diabetes: No Genetic Screening: Normal Maternal Ultrasounds/Referrals: Normal Fetal Ultrasounds or other Referrals:  None Maternal Substance Abuse:  No Significant Maternal Medications:  None Significant Maternal Lab Results:  None Other Comments:  None  Review of Systems  Constitutional: Negative for fever and chills.  HENT: Negative for tinnitus.   Eyes: Negative for blurred vision.  Respiratory: Negative for cough.   Cardiovascular: Negative for chest pain and palpitations.  Gastrointestinal: Negative for nausea, vomiting and diarrhea.  Genitourinary: Negative for dysuria.  Skin: Negative for rash.  Neurological: Negative for dizziness and headaches.  Psychiatric/Behavioral: Negative for depression and substance abuse.    Dilation: 10 Effacement (%): 90 Station: -2 Exam by:: Delta Air LinesDenise Collison RN Blood pressure 123/71, pulse 95, temperature 98.4 F  (36.9 C), temperature source Axillary, resp. rate 16, height 5' 6.5" (1.689 m), weight 83.462 kg (184 lb), last menstrual period 03/23/2013. Maternal Exam:  Uterine Assessment: Contraction strength is firm.  Contraction frequency is regular.   Cervix: Cervix evaluated by digital exam.     Fetal Exam Fetal Monitor Review: Variability: moderate (6-25 bpm).   Pattern: accelerations present and no decelerations.    Fetal State Assessment: Category I - tracings are normal.     Physical Exam  Constitutional: She is oriented to person, place, and time. She appears well-developed and well-nourished.  HENT:  Head: Normocephalic.  Eyes: EOM are normal. Pupils are equal, round, and reactive to light.  Neck: No thyromegaly present.  Cardiovascular: Normal rate.   Respiratory: Effort normal.  Lymphadenopathy:    She has no cervical adenopathy.  Neurological: She is alert and oriented to person, place, and time. She has normal reflexes.  Skin: Skin is warm.  Psychiatric: She has a normal mood and affect.    Prenatal labs: ABO, Rh: O/POS/-- (04/17 1128) Antibody: NEG (04/17 1128) Rubella: 1.13 (04/17 1128) RPR: NON REAC (04/17 1128)  HBsAg: NEGATIVE (04/17 1128)  HIV: NONREACTIVE (04/17 1128)  GBS: Negative (10/19 0000)   Assessment/Plan: Patient is 28 y.o. [id number] 1041w6d by US who presents in active labor.   #Term gestation in active labor --Admit to birthing suites on L&D --Would like water birth --IV access --GBS negative, no abx indicated at this moment  #Pain --Epidural if desired  #FWB --FHR reactive, category I --Continuous fetal monitoring    Aldona BarKoch, Kari L 12/20/2013, 10:03 PM  I have seen and examined this patient and I agree with the above. Cam HaiSHAW, Rankin Coolman CNM 10:23 PM 12/20/2013

## 2013-12-20 NOTE — MAU Note (Signed)
Pt to be admitted to labor and delivery for a water birth with midwife. Routin orders.

## 2013-12-21 LAB — RPR

## 2013-12-21 NOTE — Progress Notes (Signed)
Records indicate patient received Tdap vaccine 11/21/13 not 2012 as previously documented . Boykin PeekNancy Makayle Krahn, RN

## 2013-12-21 NOTE — Progress Notes (Signed)
Post Partum Day 1 Subjective: Kelly Logan is a 28 y/o G5P3 at 7814w6d s/p SVD. No acute events overnight. Pain is well-controlled. Lochia moderate. Mild nausea. No vomiting or difficulties voiding or ambulating. She has not had flatus. She has not had a bowel movement. She plans to breast feed. She is undecided about birth control. She plans for an outpatient circumcision.    Objective: Blood pressure 116/77, pulse 90, temperature 97.8 F (36.6 C), temperature source Oral, resp. rate 20, height 5' 6.5" (1.689 m), weight 83.462 kg (184 lb), last menstrual period 03/23/2013, unknown if currently breastfeeding.  Physical Exam:  General: alert and cooperative  Cardiovascular: RRR. No murmurs, rubs, or gallops.  Chest: Normal effort. Lungs CTA bilaterally.  Lochia: appropriate Uterine Fundus: firm DVT Evaluation: No evidence of DVT seen on physical exam.   Recent Labs  12/20/13 2110  HGB 12.3  HCT 34.9*    Assessment/Plan: A: 28 y/o G5P3 PPD1 s/p SVD.  Plan for discharge tomorrow   LOS: 1 day   Janalyn RouseHoward, Tayla 12/21/2013, 7:54 AM

## 2013-12-21 NOTE — Plan of Care (Signed)
Problem: Phase I Progression Outcomes Goal: Other Phase I Outcomes/Goals Outcome: Completed/Met Date Met:  12/21/13  Problem: Phase II Progression Outcomes Goal: Pain controlled on oral analgesia Outcome: Completed/Met Date Met:  12/21/13 Goal: Progress activity as tolerated unless otherwise ordered Outcome: Completed/Met Date Met:  12/21/13 Goal: Afebrile, VS remain stable Outcome: Completed/Met Date Met:  12/21/13 Goal: Tolerating diet Outcome: Completed/Met Date Met:  12/21/13 Goal: Other Phase II Outcomes/Goals Outcome: Completed/Met Date Met:  12/21/13

## 2013-12-21 NOTE — Lactation Note (Signed)
This note was copied from the chart of [name]. Lactation Consultation Note  P3, Ex BF.  Baby recently breastfed for 18 min. Mother denies problems or questions. States breastfeeding is going well. Mom encouraged to feed baby 8-12 times/24 hours and with feeding cues.  Mom made aware of O/P services, breastfeeding support groups, community resources, and our phone # for post-discharge questions.    Patient Name: [name]: 12/21/2013 Reason for consult: Initial assessment   Maternal Data Has patient been taught Hand Expression?: Yes Does the patient have breastfeeding experience prior to this delivery?: Yes  Feeding Feeding Type: Breast Fed Length of feed: 18 min  LATCH Score/Interventions                      Lactation Tools Discussed/Used     Consult Status Consult Status: Follow-up Date: 12/22/13 Follow-up type: In-patient    Dahlia ByesBerkelhammer, [facility] LLCBoschen 12/21/2013, 1:47 PM

## 2013-12-21 NOTE — Plan of Care (Signed)
Problem: Consults Goal: Postpartum Patient Education (See Patient Education module for education specifics.) Outcome: Completed/Met Date Met:  12/21/13  Problem: Phase I Progression Outcomes Goal: Pain controlled with appropriate interventions Outcome: Completed/Met Date Met:  12/21/13 Goal: Voiding adequately Outcome: Completed/Met Date Met:  12/21/13 Goal: OOB as tolerated unless otherwise ordered Outcome: Completed/Met Date Met:  12/21/13 Goal: VS, stable, temp < 100.4 degrees F Outcome: Completed/Met Date Met:  12/21/13 Goal: Initial discharge plan identified Outcome: Completed/Met Date Met:  12/21/13

## 2013-12-22 NOTE — Discharge Instructions (Signed)

## 2013-12-22 NOTE — Plan of Care (Signed)
Problem: Discharge Progression Outcomes Goal: Activity appropriate for discharge plan Outcome: Completed/Met Date Met:  12/22/13 Goal: Tolerating diet Outcome: Completed/Met Date Met:  12/22/13 Goal: Pain controlled with appropriate interventions Outcome: Completed/Met Date Met:  12/22/13 Goal: Discharge plan in place and appropriate Outcome: Completed/Met Date Met:  12/22/13

## 2013-12-22 NOTE — Discharge Summary (Signed)
Obstetric Discharge Summary Reason for Admission: onset of labor Prenatal Procedures: none Intrapartum Procedures: spontaneous vaginal delivery Postpartum Procedures: none Complications-Operative and Postpartum: none  Hospital Course:  Patient was admitted for onset of labor. She progressed rapidly and delivered in the MAU  Delivery Note At 8:24 PM a viable female was delivered via Vaginal, Spontaneous Delivery (Presentation: Right Occiput Anterior). APGAR: 6, 6; weight 6 lb 5.8 oz (2886 g).  Placenta status: Intact, Spontaneous. Cord: 3 vessels with the following complications: None. Cord pH: na  Anesthesia: None  Episiotomy: None Lacerations: None Est. Blood Loss (mL): 250  Mom to postpartum. Baby to Couplet care / Skin to Skin.  Kelly Logan, Kelly Logan 12/20/2013, 10:00 PM  I have seen and examined this patient and I agree with the above. Resident and Logan arrived just after delivery & cord clamp/cut by RN. Phone record shows call at 8:24p to come for delivery. We collected the hospital cord blood sample, delivered the placenta, and inspected for lacs. Pt and infant stable. Kelly Logan, Kelly Logan 10:19 PM 12/20/2013    Postpartum, the patient did well with no complications.  On the day of discharge,  Pt denied problems with ambulating, voiding or po intake.  She denies nausea or vomiting.  Pain was well controlled.  She has had flatus. She has had bowel movement.  Lochia Minimal.  Plan for birth control is undecided.    H/H: Lab Results  Component Value Date/Time   HGB 12.3 12/20/2013 09:10 PM   HCT 34.9* 12/20/2013 09:10 PM    Filed Vitals:   12/22/13 0535  BP: 115/65  Pulse: 77  Temp: 98.1 F (36.7 C)  Resp:     Physical Exam: VSS NAD Abd: Appropriately tender, ND, Fundus firm No c/c/e, Neg homan's sign, neg cords Lochia Appropriate  Discharge Diagnoses: Term Pregnancy-delivered  Discharge Information: Date: 12/22/2013 Activity: pelvic rest Diet: routine   Medications: PNV Breast feeding:  Yes Condition: stable Instructions: refer to handout Discharge to: home      Medication List    STOP taking these medications        hydroxyprogesterone caproate 250 mg/mL Oil injection  Commonly known as:  DELALUTIN      TAKE these medications        prenatal multivitamin Tabs tablet  Take 1 tablet by mouth daily at 12 noon.           Follow-up Information    [facility] [place]. Schedule an appointment as soon as possible for a visit in 6 weeks.   Specialty:  Obstetrics and Gynecology   Why:  for post partum follow up   Contact information:   [address], [address] [place] [id number] [phone]      Kelly Logan, Kelly Logan 12/22/2013,9:06 AM   OB fellow attestation Post Partum Day 2 I have seen and examined this patient and agree with above documentation in the resident's note.   Kelly Logan is a 28 y.o. [phone] s/p SVD.  Pt denies problems with ambulating, voiding or po intake. Pain is well controlled.  Method of Feeding: breast  PE:  BP 115/65 mmHg  Pulse 77  Temp(Src) 98.1 F (36.7 C) (Oral)  Resp 18  Ht 5' 6.5" (1.689 m)  Wt 184 lb (83.462 kg)  BMI 29.26 kg/m2  SpO2 100%  LMP 03/23/2013  Breastfeeding? Unknown Fundus firm  Plan for discharge: today  Kelly Logan,Kelly Brinson ROCIO, Kelly Logan 9:13 AM

## 2013-12-22 NOTE — Plan of Care (Signed)
Problem: Discharge Progression Outcomes Goal: Barriers To Progression Addressed/Resolved Outcome: Not Applicable Date Met:  91/98/02 Goal: Complications resolved/controlled Outcome: Not Applicable Date Met:  21/79/81 Goal: Afebrile, VS remain stable at discharge Outcome: Completed/Met Date Met:  12/22/13 Goal: Other Discharge Outcomes/Goals Outcome: Not Applicable Date Met:  02/54/86

## 2013-12-26 ENCOUNTER — Encounter: Payer: BC Managed Care – PPO | Admitting: Advanced Practice Midwife

## 2013-12-26 ENCOUNTER — Telehealth: Payer: Self-pay | Admitting: *Deleted

## 2013-12-26 NOTE — Telephone Encounter (Signed)
Patient's insurance company called asking questions about the patient's care so she may receive a breast pump. Called patient and told her that her insurance company will need to fax us a form that we will fill out and send back to them. Patient verbalized understanding and had no other questions

## 2014-01-30 ENCOUNTER — Ambulatory Visit: Payer: BC Managed Care – PPO | Admitting: Advanced Practice Midwife

## 2014-02-01 ENCOUNTER — Encounter: Payer: Self-pay | Admitting: Obstetrics & Gynecology

## 2014-02-01 ENCOUNTER — Ambulatory Visit (INDEPENDENT_AMBULATORY_CARE_PROVIDER_SITE_OTHER): Payer: BC Managed Care – PPO | Admitting: Obstetrics & Gynecology

## 2014-02-01 NOTE — Progress Notes (Signed)
Patient ID: Kelly Logan, female   DOB: 08/14/1985, 28 y.o.   MRN: [id number] Post Partum Exam  Kelly Degreelicia Spratlin is a 28 y.o. female  who presents for a postpartum visit. She is 6 weeks postpartum following a SVD on 12/20/13. I have fully reviewed the prenatal and intrapartum course. The delivery was at 199w6d  gestational weeks. Outcome: SVD. Anesthesia: None Postpartum course has been uncomplicated. Baby's course has been uncomplicated. Baby is feeding by breast. Bleeding has stopped. Bowel function is normal. Bladder function is normal. Patient is  sexually active. Contraception method is using condomsbut husband is scheduling a vasectomy. Postpartum depression screening: negative.  The following portions of the patient's history were reviewed and updated as appropriate: allergies, current medications, past family history, past medical history, past social history, past surgical history and problem list.  Review of Systems Pertinent items are noted in HPI.   Objective:    BP 116/78 mmHg  Pulse 78  Resp 16  Ht 5\' 5"  (1.651 m)  Wt 211 lb (95.709 kg)  BMI 35.11 kg/m2  Breastfeeding? Yes  General:  alert, cooperative and no distress   Breasts:  inspection negative, no nipple discharge or bleeding, no masses or nodularity palpable  Lungs: clear to auscultation bilaterally  Heart:  regular rate and rhythm, S1, S2 normal, no murmur, click, rub or gallop  Abdomen: soft, non-tender; bowel sounds normal; no masses,  no organomegaly   Vulva:  refused  Vagina: refused  Cervix:  refused  Corpus: not examined  Adnexa:  refused  Rectal Exam: refused        Assessment:    Nml postpartum exam. Pap smear not done at today's visit.   Plan:    1. Contraception: vasectomy 2. Condoms until vasectomy. 3. Follow up in: 1 year or as needed.

## 2015-04-01 IMAGING — US US OB COMP +14 WK
1 series · 12 of 28 positions shown · non-contrast
Comparison: none

OBSTETRICS REPORT
                      (Signed Final 08/10/2013 [DATE])

Service(s) Provided
 US OB COMP + 14 WK                                    76805.1
Indications
 Basic anatomic survey
 Previous pre-term delivery - 36 wks
 Poor obstetric history (prior pre-term labor)
Fetal Evaluation
 Num Of Fetuses:    1
 Fetal Heart Rate:  155                          bpm
 Cardiac Activity:  Observed
 Presentation:      Cephalic
 Placenta:          Anterior, above cervical os
 P. Cord            Visualized, central
 Insertion:
 Amniotic Fluid
 AFI FV:      Subjectively within normal limits
                                             Larg Pckt:     4.2  cm
Biometry
 BPD:     43.3  mm     G. Age:  19w 1d                CI:        74.77   70 - 86
                                                      FL/HC:      18.2   16.1 -
 HC:     158.9  mm     G. Age:  18w 6d       31  %    HC/AC:      1.17   1.09 -
 AC:     136.3  mm     G. Age:  19w 0d       48  %    FL/BPD:
 FL:      28.9  mm     G. Age:  18w 6d       39  %    FL/AC:      21.2   20 - 24
 HUM:       30  mm     G. Age:  19w 6d       73  %
 CER:     18.7  mm     G. Age:  18w 2d       31  %
 Est. FW:     267  gm      0 lb 9 oz     44  %
Gestational Age
 LMP:           20w 0d        Date:  03/23/13                 EDD:   12/28/13
 U/S Today:     19w 0d                                        EDD:   01/04/14
 Best:          19w 0d     Det. By:  Early Ultrasound         EDD:   01/04/14
                                     (06/03/13)
Anatomy
 Cranium:          Appears normal         Aortic Arch:      Appears normal
 Fetal Cavum:      Not well visualized    Ductal Arch:      Appears normal
 Ventricles:       Appears normal         Diaphragm:        Appears normal
 Choroid Plexus:   Appears normal         Stomach:          Appears normal, left
                                                            sided
 Cerebellum:       Appears normal         Abdomen:          Appears normal
 Posterior Fossa:  Appears normal         Abdominal Wall:   Appears nml (cord
                                                            insert, abd wall)
 Nuchal Fold:      Appears normal         Cord Vessels:     Appears normal (3
                                                            vessel cord)
 Face:             Appears normal         Kidneys:          Appear normal
                   (orbits and profile)
 Lips:             Appears normal         Bladder:          Appears normal
 Heart:            Appears normal         Spine:            Appears normal
                   (4CH, axis, and
                   situs)
 RVOT:             Not well visualized    Lower             Appears normal
                                          Extremities:
 LVOT:             Appears normal         Upper             Appears normal
 Other:  Nasal bone visualized. Heels visualized. Male gender.
Cervix Uterus Adnexa
 Cervical Length:    4        cm
 Cervix:       Normal appearance by transabdominal scan.
 Left Ovary:    Within normal limits.
 Right Ovary:   Within normal limits.
Impression
INDICATION: 27 yr old [id number] at 75w4d for fetal anatomic
 survey. Remote read.

[Series 1: us ob comp +14 wk · 12 of 106 slices shown]
[im 4/106]
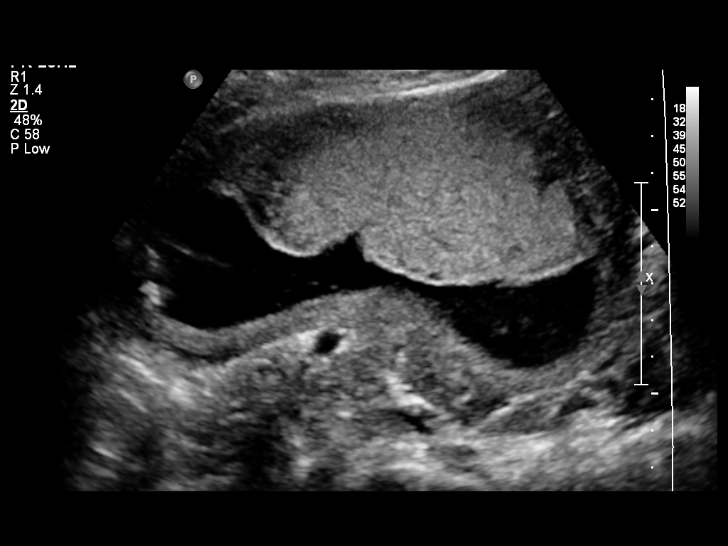
[im 12/106]
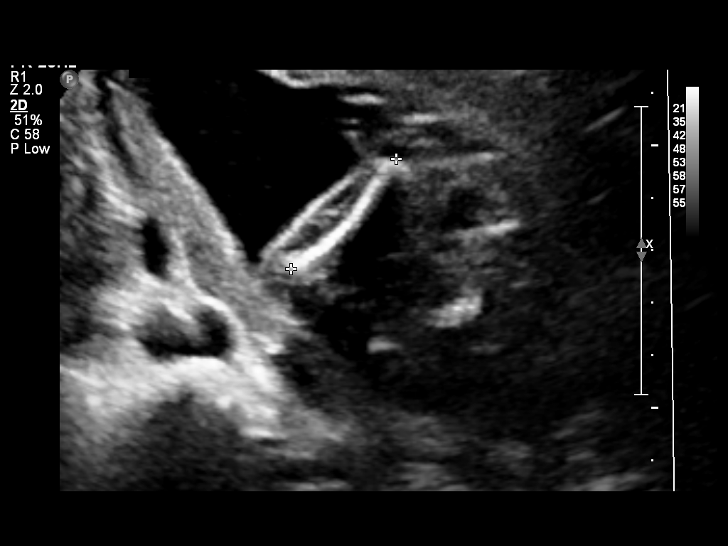
[im 20/106]
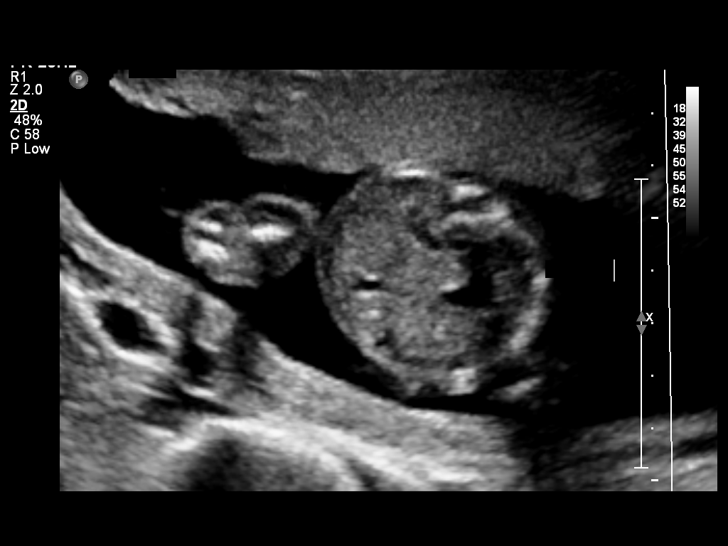
[im 32/106]
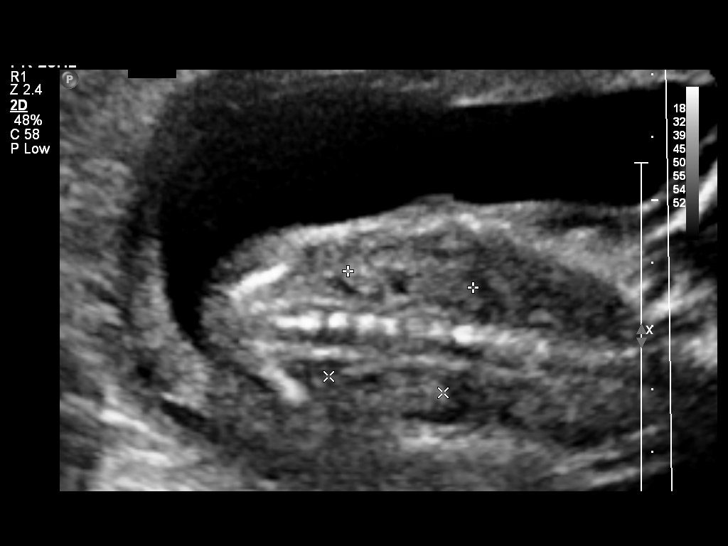
[im 39/106]
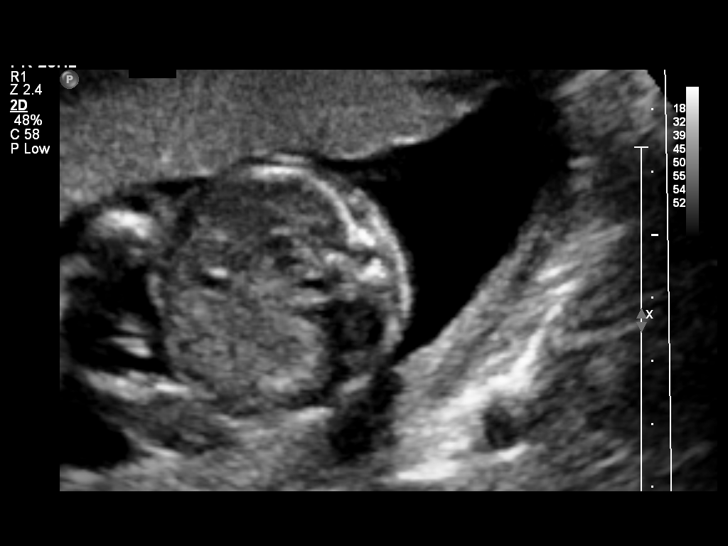
[im 47/106]
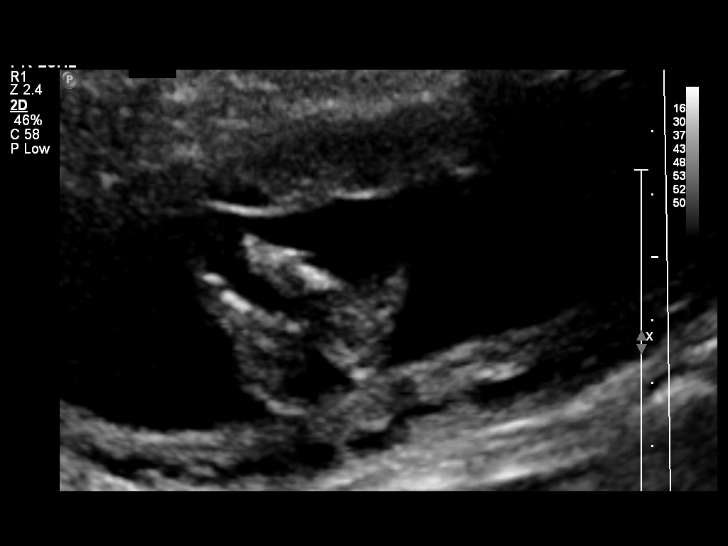
[im 59/106]
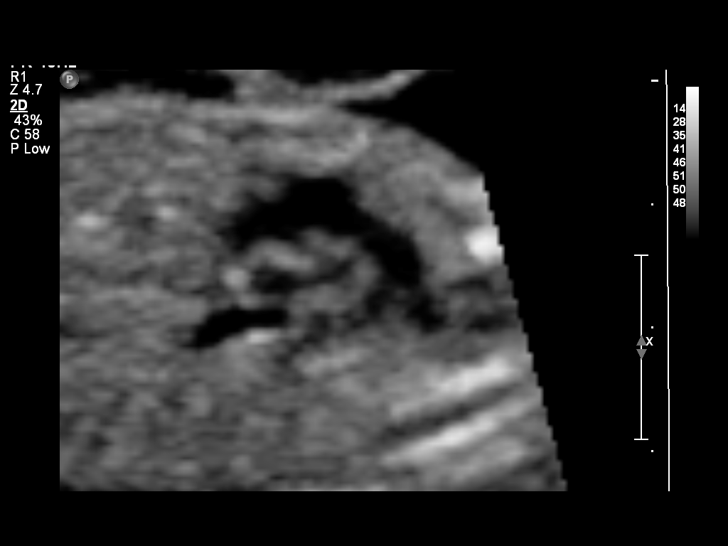
[im 67/106]
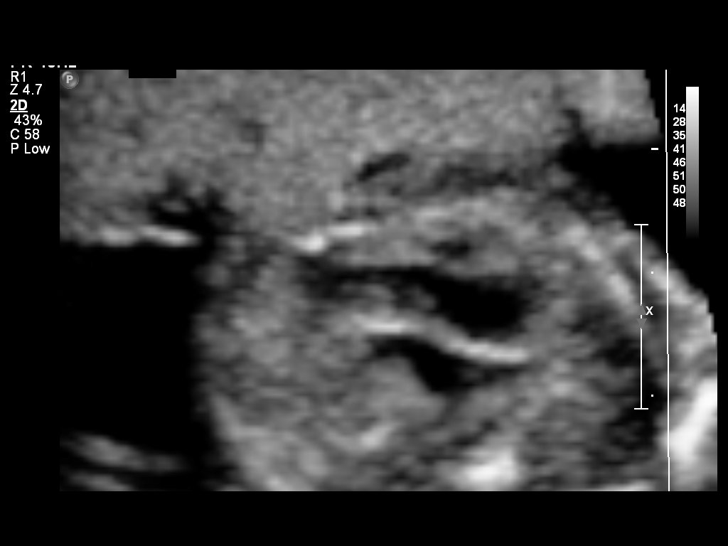
[im 74/106]
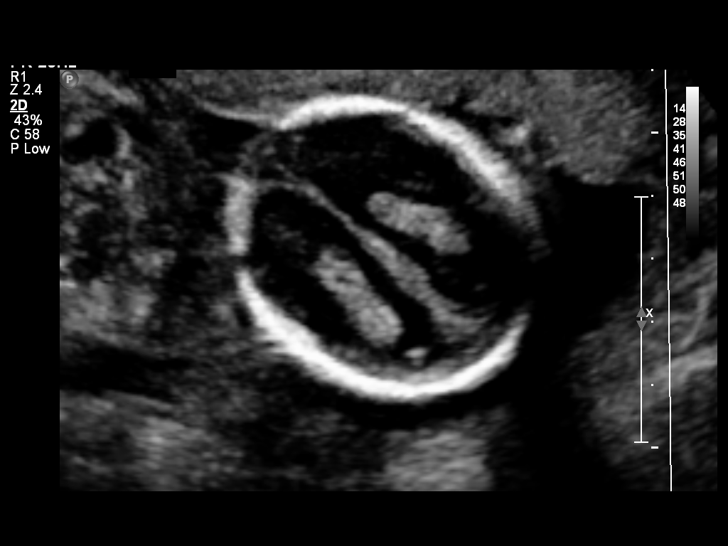
[im 86/106]
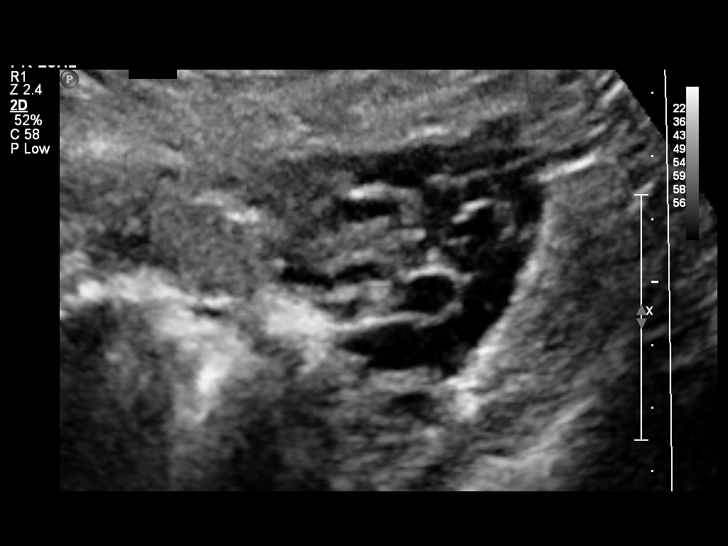
[im 94/106]
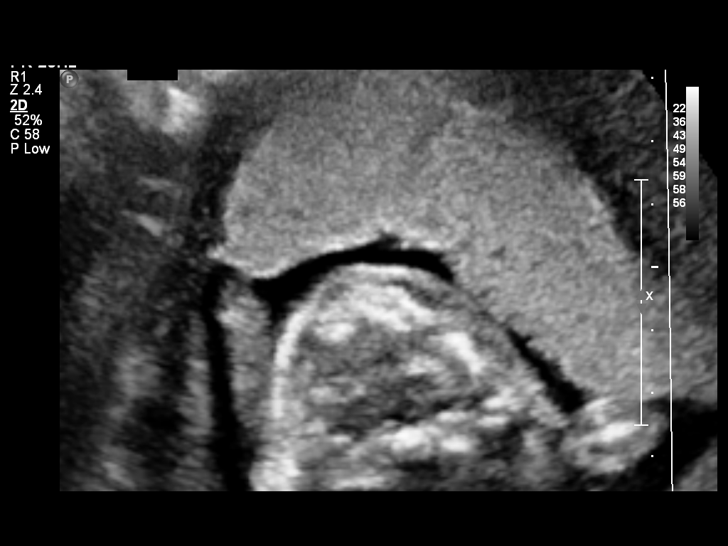
[im 102/106]
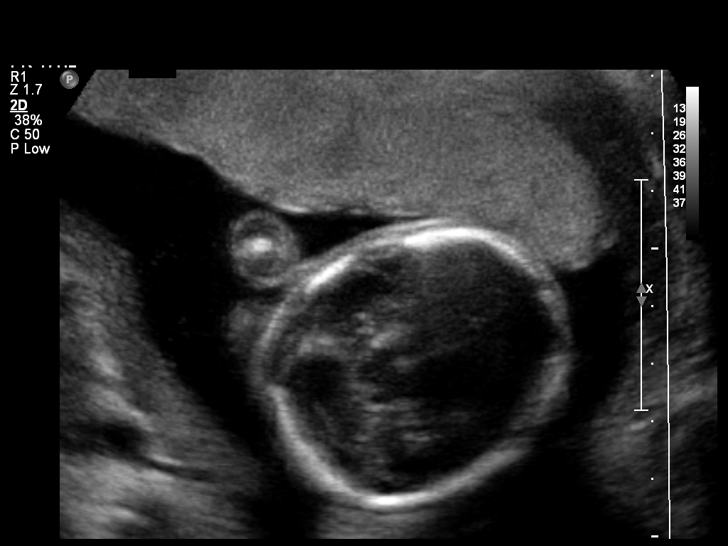

[12 of 28 positions shown; findings below may reference images not displayed]

FINDINGS: 1. Single intrauterine pregnancy.
 2. Fetal biometry is consistent with dating.
 3. Anterior placenta without evidence of previa.
 4. Normal amniotic fluid volume.
 5. Normal transabdominal cervical length.
 6. The views of the cavum and right outflow tract are limited.
 7. The remainder of the limited anatomy survey is normal.
Recommendations

 1. Appropriate fetal growth.
 2. Limited anatomy survey:
 - recommend follow up in 4 weeks to complete anatomic
 survey

 questions or concerns.

## 2015-05-02 IMAGING — US US MFM OB TRANSVAGINAL
1 series · 12 of 12 positions shown · non-contrast
Comparison: none

[Series 1: us mfm ob transvaginal · 12 acquisitions, 12 frames shown]
[im 1/12]
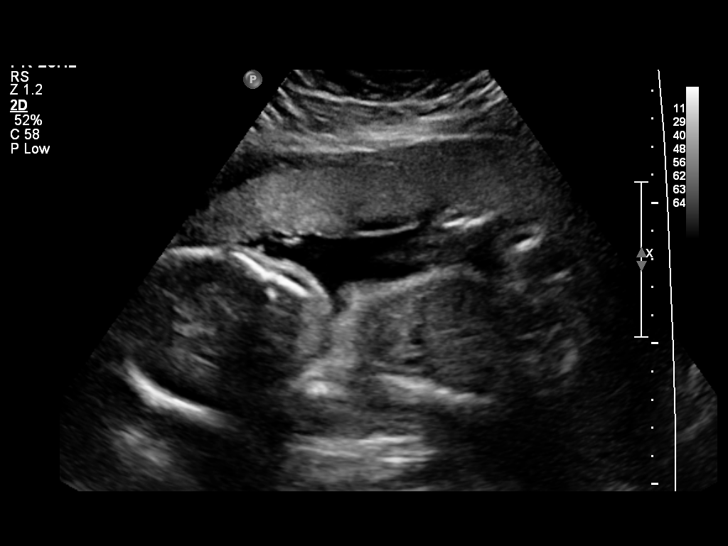
[im 2/12]
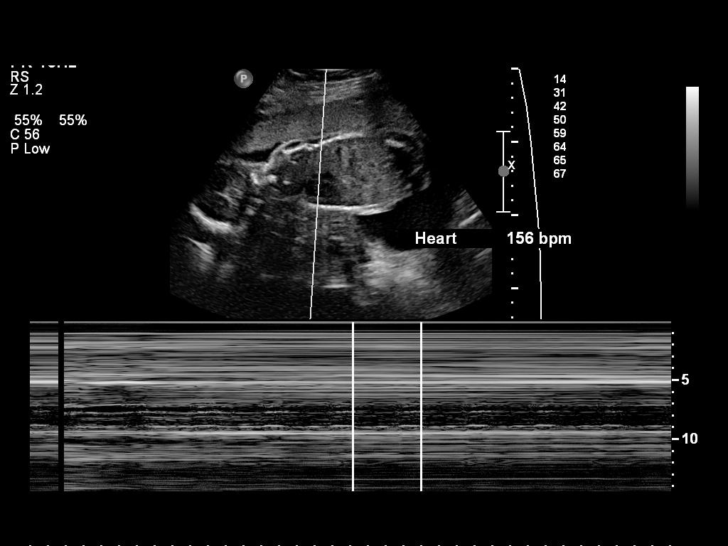
[im 3/12]
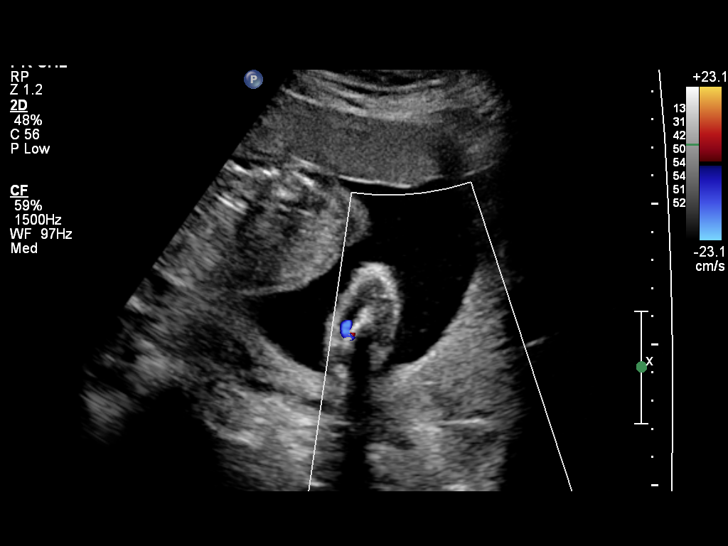
[im 4/12]
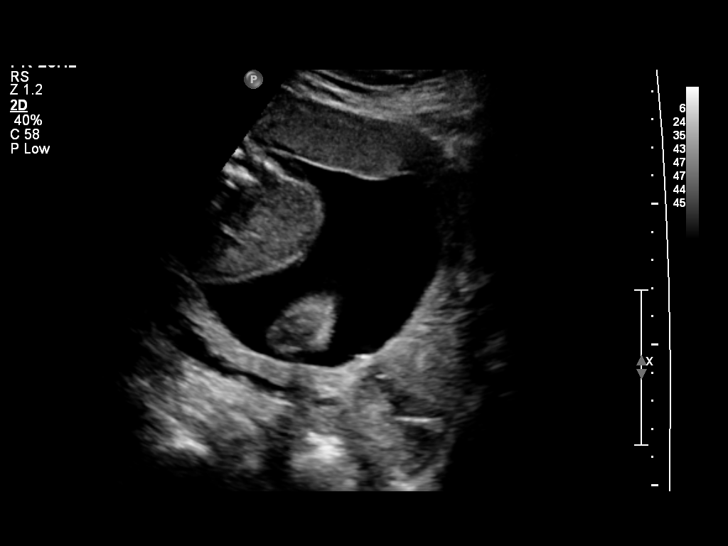
[im 5/12]
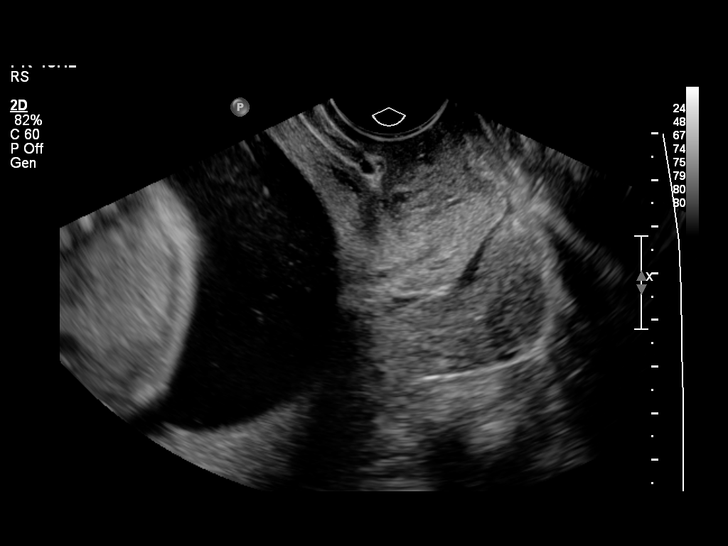
[im 6/12]
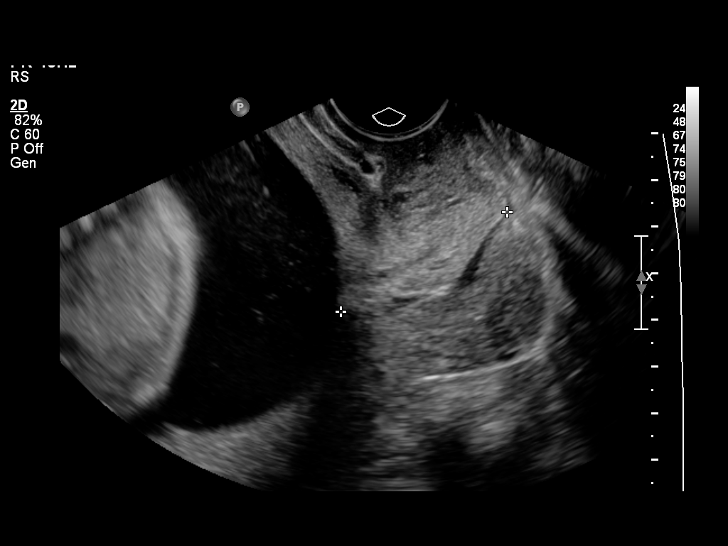
[im 7/12]
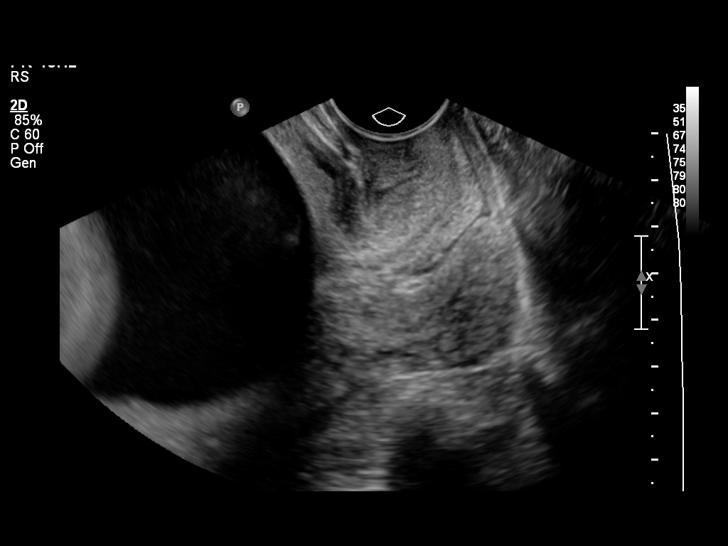
[im 8/12]
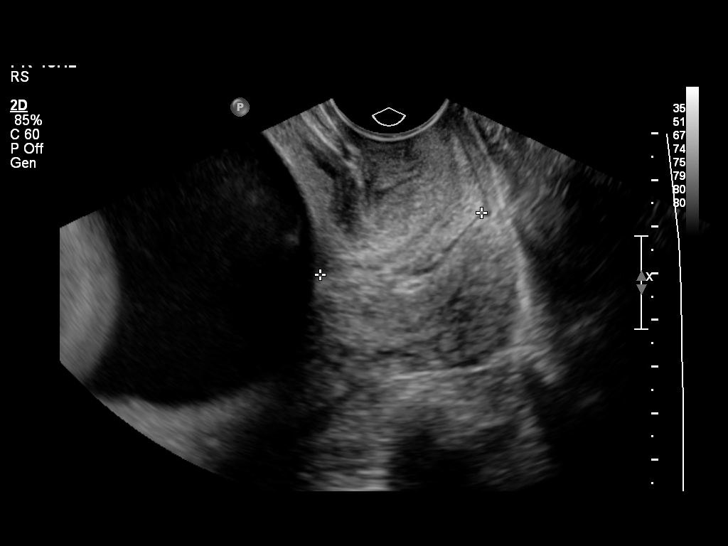
[im 9/12]
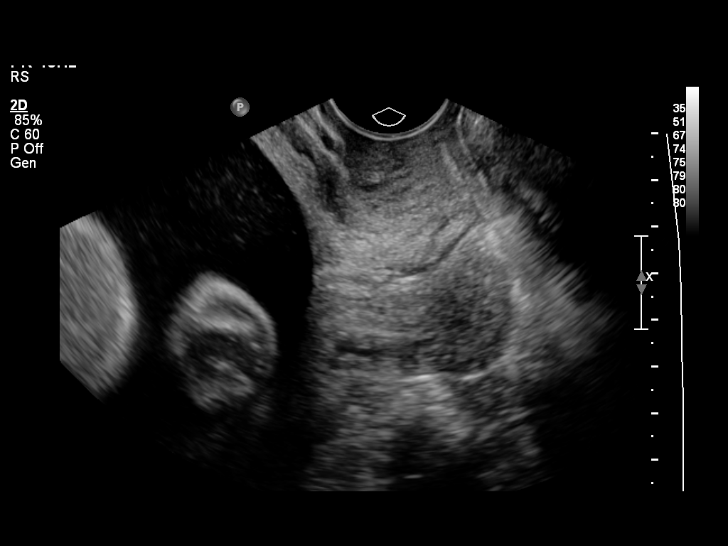
[im 10/12]
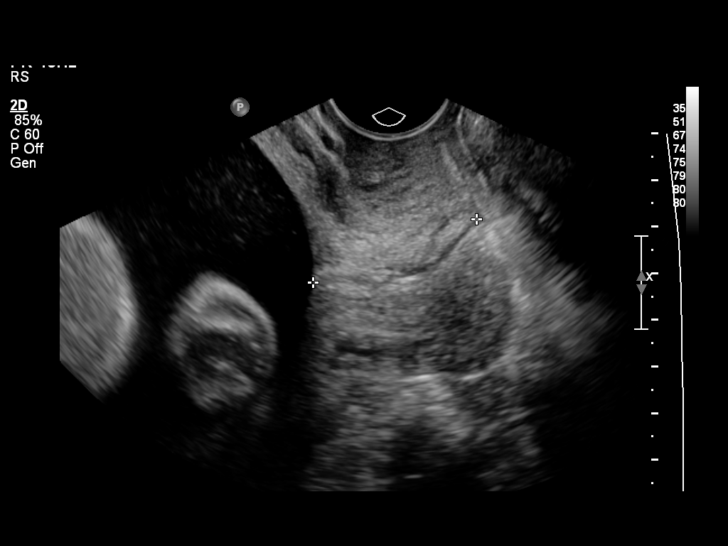
[im 11/12]
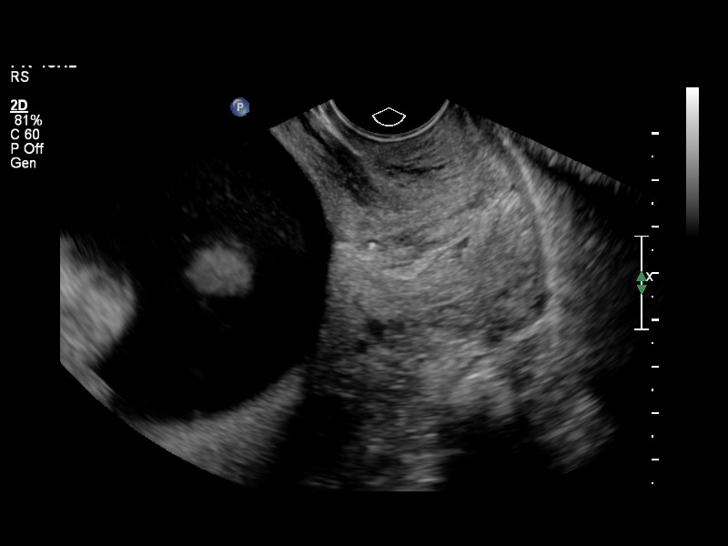
[im 12/12]
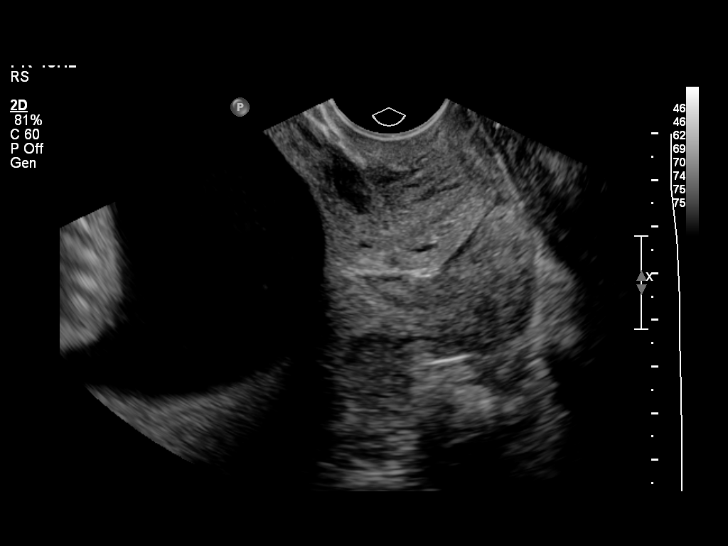

[12 of 12 positions shown; findings below may reference images not displayed]

OBSTETRICS REPORT
                      (Signed Final 09/10/2013 [DATE])

Service(s) Provided

 US MFM OB TRANSVAGINAL                                76817.2
Indications

 Previous pre-term delivery - 36 wks
 Poor obstetric history (prior pre-term labor)
 Assess cervical length
 Pelvic / Abdominal pain
Fetal Evaluation

 Num Of Fetuses:    1
 Fetal Heart Rate:  156                          bpm
 Cardiac Activity:  Observed
 Presentation:      Transverse, head to
                    maternal right

 Amniotic Fluid
 AFI FV:      Subjectively within normal limits
Gestational Age

 LMP:           24w 3d        Date:  03/23/13                 EDD:   12/28/13
 Best:          23w 3d     Det. By:  Early Ultrasound         EDD:   01/04/14
                                     (06/03/13)
Cervix Uterus Adnexa

 Cervical Length:    3.8       cm

 Cervix:       Normal appearance by transvaginal scan
Impression

 Single IUP at 23w 3d
 Limited ultrasound performed for cervical length
 TVUS- cervical length 3.8 cm without funneling or dynamic
 changes
 Normal amniotic fluid volume
Recommendations

 Follow up ultrasounds as clinically indicated
 questions or concerns.

## 2015-07-27 IMAGING — US US OB LIMITED
1 series · 13 of 28 positions shown · non-contrast
Comparison: none

[Series 1: us fetal bpp w/o nonstress · non-contrast · 41 acquisitions, 13 frames shown]
[im 2/41]
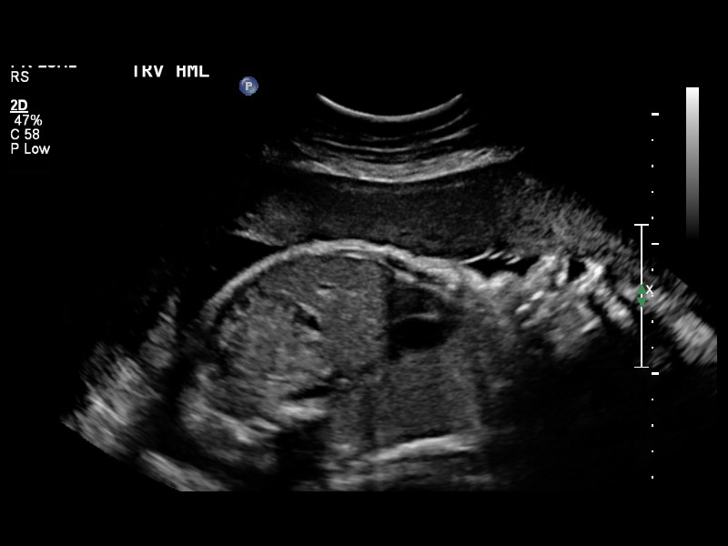
[im 5/41]
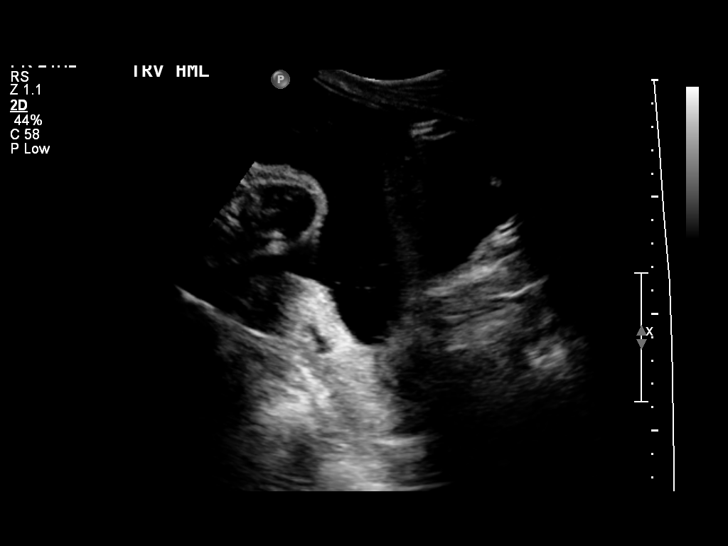
[im 8/41]
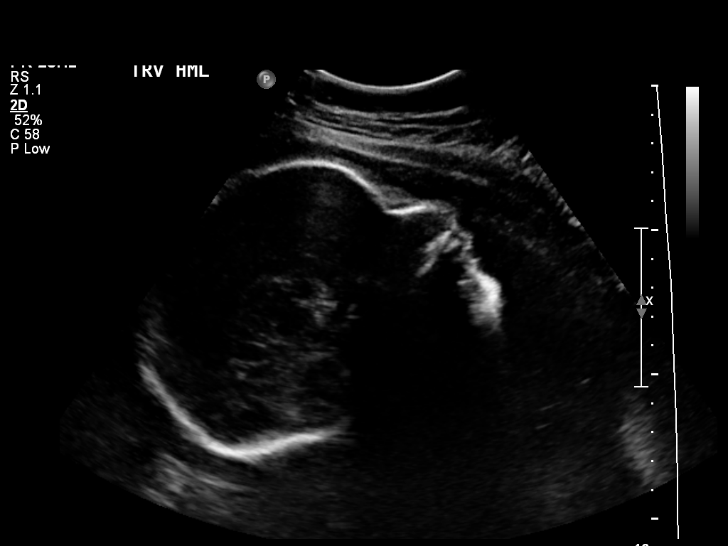
[im 11/41]
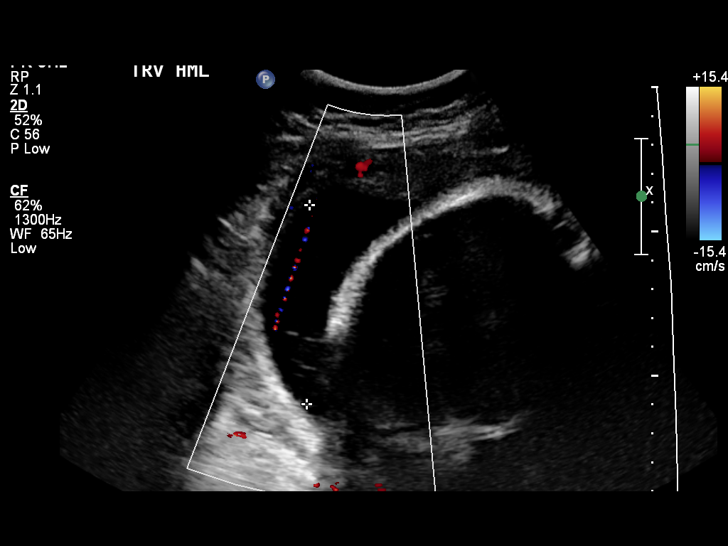
[im 14/41]
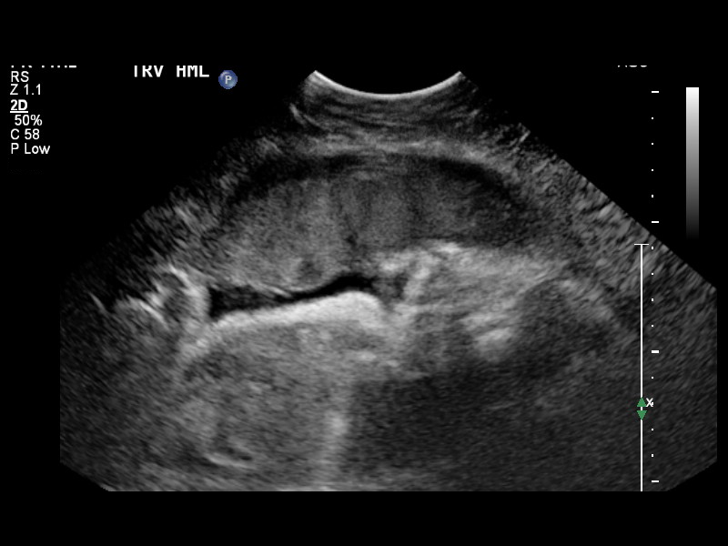
[im 17/41]
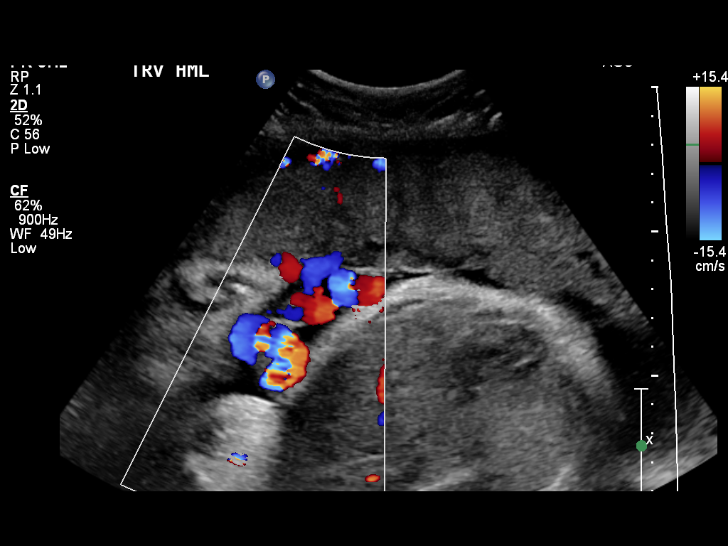
[im 21/41]
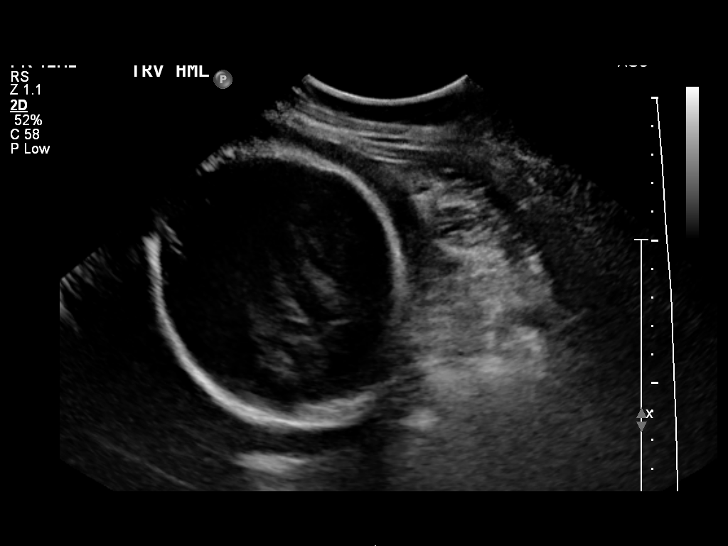
[im 24/41]
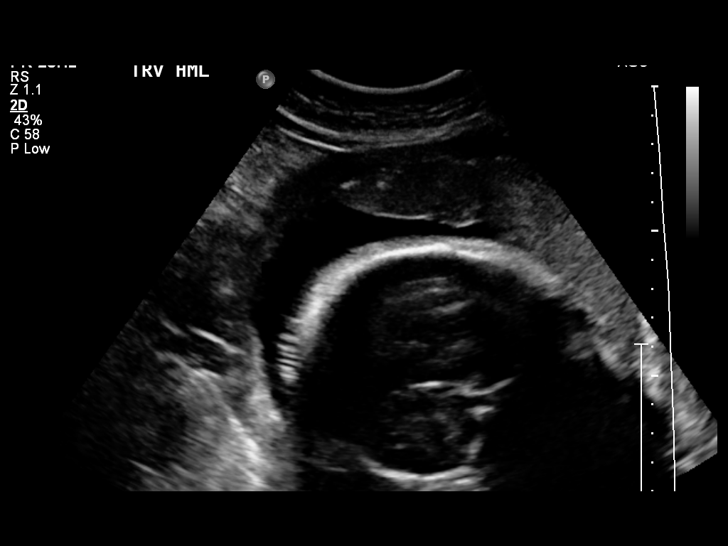
[im 27/41]
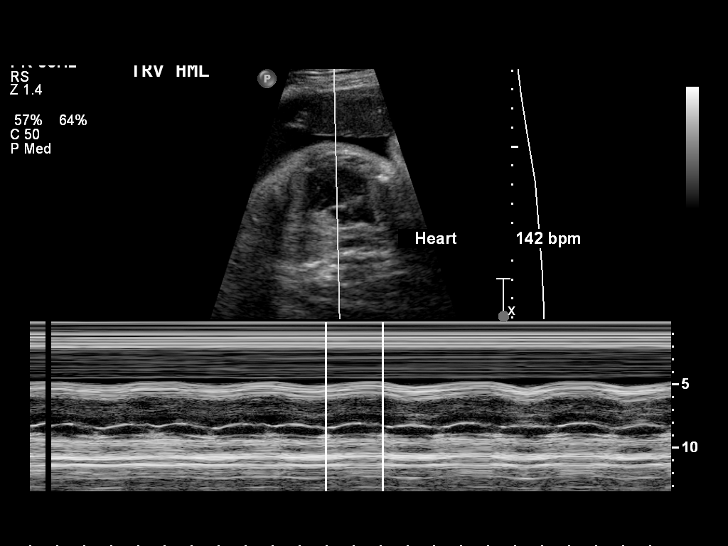
[im 30/41]
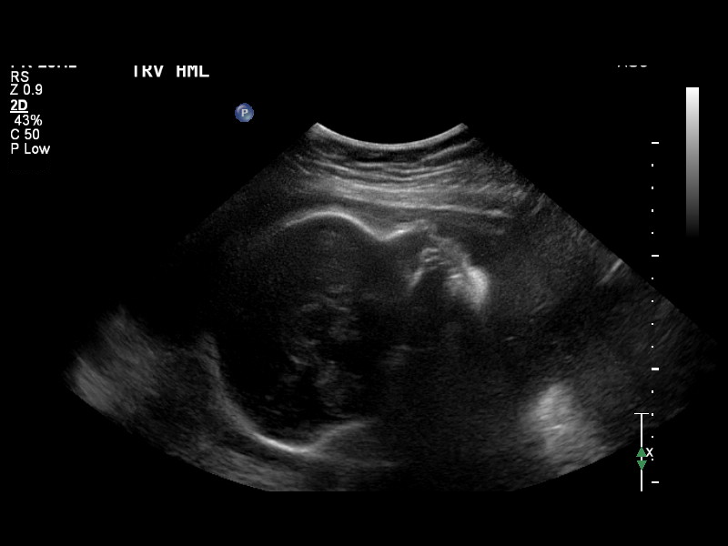
[im 33/41]
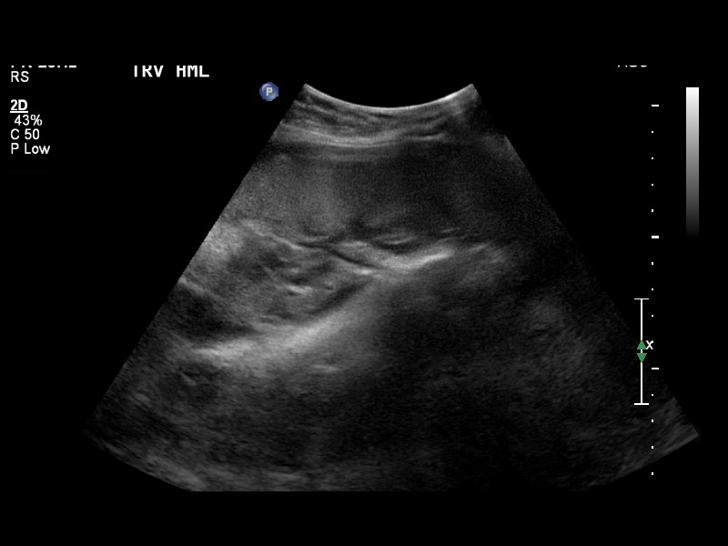
[im 36/41]
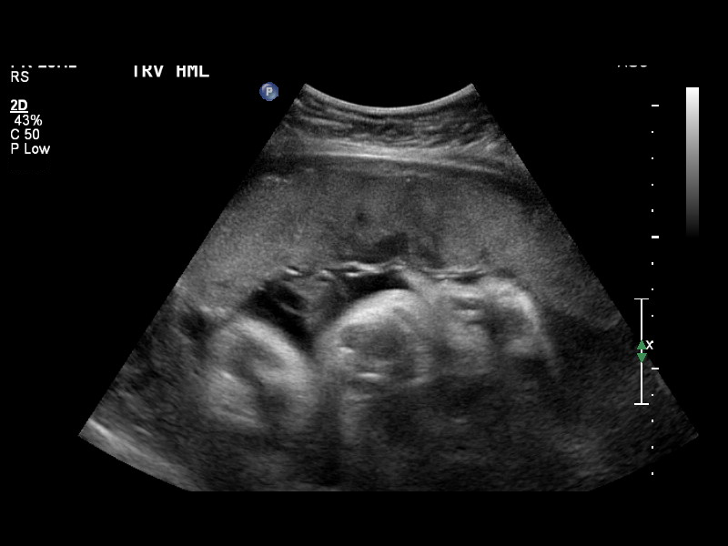
[im 39/41]
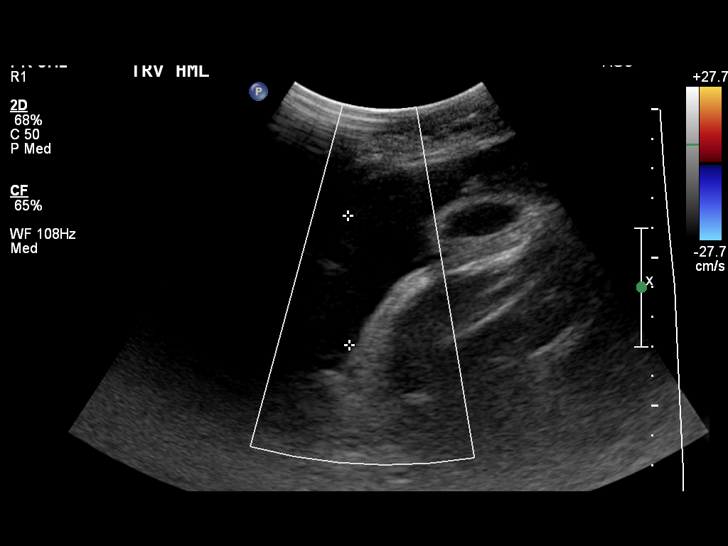

[13 of 28 positions shown; findings below may reference images not displayed]

OBSTETRICS REPORT
                      (Signed Final 12/05/2013 [DATE])

Service(s) Provided

 [HOSPITAL]                                         76815.0
Indications

 Non-reactive NST, FHR decelerations
 Leaking of fluid per patient
 Previous pre-term deliveries ([place] - 36 weeks; on
 17P)
Fetal Evaluation

 Num Of Fetuses:    1
 Fetal Heart Rate:  122                          bpm
 Cardiac Activity:  Observed
 Presentation:      Transverse, head to
                    maternal left
 Placenta:          Anterior, above cervical os
 P. Cord            Visualized, central
 Insertion:

 Amniotic Fluid
 AFI FV:      Polyhydramnios
 AFI Sum:     25.82   cm       96  %Tile     Larg Pckt:    8.85  cm
 RUQ:   4.35    cm   RLQ:    5.85   cm    LUQ:   6.77    cm   LLQ:    8.85   cm
Biophysical Evaluation

 Amniotic F.V:   Polyhydramnios             F. Tone:        Observed
 F. Movement:    Observed                   Score:          [DATE]
 F. Breathing:   Observed
Gestational Age

 LMP:           36w 5d        Date:  03/23/13                 EDD:   12/28/13
 Best:          35w 5d     Det. By:  Early Ultrasound         EDD:   01/04/14
                                     (06/03/13)
Cervix Uterus Adnexa

 Cervix:       Not visualized (advanced GA >61wks)
 Uterus:       No abnormality visualized.
 Cul De Sac:   No free fluid seen.

 Left Ovary:    Not visualized.
 Right Ovary:   Not visualized.
 Adnexa:     No abnormality visualized.
Impression

 Single IUP agt 72w2d
 BPP [DATE]
 Mild polyhydramnios noted with an AFI of 25.8 cm
 Transverse presentation wtih fetal head to maternal left
Recommendations

 Recommend 2x weekly NSTs with weekly AFI due to
 polyhydramnios
 Consider limited ultrasound in 1-2 weeks to check
 presentation - would consider external cephalic version if
 malpresentation is noted
 Delivery at 39 weeks in the absence of other complications

 questions or concerns.

## 2017-01-19 ENCOUNTER — Other Ambulatory Visit (HOSPITAL_COMMUNITY): Payer: Self-pay | Admitting: Family Medicine

## 2017-01-19 DIAGNOSIS — N644 Mastodynia: Secondary | ICD-10-CM

## 2017-01-27 ENCOUNTER — Ambulatory Visit (HOSPITAL_COMMUNITY): Admission: RE | Admit: 2017-01-27 | Payer: BLUE CROSS/BLUE SHIELD | Source: Ambulatory Visit

## 2017-01-27 ENCOUNTER — Other Ambulatory Visit (HOSPITAL_COMMUNITY): Payer: Self-pay

## 2017-01-27 ENCOUNTER — Encounter (HOSPITAL_COMMUNITY): Payer: Self-pay

## 2017-02-03 ENCOUNTER — Ambulatory Visit (HOSPITAL_COMMUNITY)
Admission: RE | Admit: 2017-02-03 | Discharge: 2017-02-03 | Disposition: A | Discharge status: Home / Self Care | Payer: BLUE CROSS/BLUE SHIELD | Source: Ambulatory Visit | Attending: Family Medicine | Admitting: Family Medicine

## 2017-02-03 DIAGNOSIS — N644 Mastodynia: Secondary | ICD-10-CM | POA: Insufficient documentation
